# Patient Record
Sex: Male | Born: 1985 | Race: White | Hispanic: No | Marital: Married | Smoking: Never smoker
Health system: Southern US, Community
[De-identification: ages and names within clinical notes are randomized; demographics above are authoritative.]

## PROBLEM LIST (undated history)

## (undated) DIAGNOSIS — G43909 Migraine, unspecified, not intractable, without status migrainosus: Secondary | ICD-10-CM

## (undated) HISTORY — PX: WISDOM TOOTH EXTRACTION: SHX21

---
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
## 2012-08-21 ENCOUNTER — Emergency Department (HOSPITAL_COMMUNITY)
Admission: EM | Admit: 2012-08-21 | Discharge: 2012-08-21 | Disposition: A | Discharge status: Home / Self Care | Payer: BC Managed Care – PPO | Source: Home / Self Care | Attending: Family Medicine | Admitting: Family Medicine

## 2012-08-21 ENCOUNTER — Encounter (HOSPITAL_COMMUNITY): Payer: Self-pay | Admitting: Emergency Medicine

## 2012-08-21 DIAGNOSIS — A0811 Acute gastroenteropathy due to Norwalk agent: Secondary | ICD-10-CM

## 2012-08-21 HISTORY — DX: Migraine, unspecified, not intractable, without status migrainosus: G43.909

## 2012-08-21 MED ORDER — SODIUM CHLORIDE 0.9 % IV SOLN
Freq: Once | INTRAVENOUS | Status: AC
  Administered 2012-08-21: 09:00:00 via INTRAVENOUS

## 2012-08-21 MED ORDER — ONDANSETRON HCL 4 MG/2ML IJ SOLN
4.0000 mg | Freq: Once | INTRAMUSCULAR | Status: AC
  Administered 2012-08-21: 4 mg via INTRAVENOUS

## 2012-08-21 MED ORDER — ONDANSETRON HCL 4 MG PO TABS: 4.0000 mg | ORAL_TABLET | Freq: Four times a day (QID) | ORAL | Status: DC

## 2012-08-21 MED ORDER — ONDANSETRON HCL 4 MG/2ML IJ SOLN
INTRAMUSCULAR | Status: AC
  Filled 2012-08-21: qty 2

## 2012-08-21 NOTE — ED Provider Notes (Signed)
  CSN: [id number]     Arrival date & time 08/21/12  7829 History     First MD Initiated Contact with Patient 08/21/12 [phone]     Chief Complaint  Patient presents with  . Emesis   (Consider location/radiation/quality/duration/timing/severity/associated sxs/prior Treatment) Patient is a 27 y.o. male presenting with vomiting. The history is provided by the patient and the spouse.  Emesis Severity:  Moderate Duration:  1 day Timing:  Intermittent Quality:  Bilious material Progression:  Unchanged Recent urination:  Normal Associated symptoms: chills and diarrhea   Associated symptoms: no fever   Risk factors: no sick contacts, no suspect food intake and no travel to endemic areas     Past Medical History  Diagnosis Date  . Migraine    History reviewed. No pertinent past surgical history. No family history on file. History  Substance Use Topics  . Smoking status: Never Smoker   . Smokeless tobacco: Not on file  . Alcohol Use: Yes    Review of Systems  Constitutional: Positive for chills and appetite change. Negative for fever.  Gastrointestinal: Positive for nausea, vomiting and diarrhea. Negative for blood in stool.  Genitourinary: Negative.     Allergies  Review of patient's allergies indicates no known allergies.  Home Medications   Current Outpatient Rx  Name  Route  Sig  Dispense  Refill  . Bismuth Subsalicylate (PEPTO-BISMOL PO)   Oral   Take by mouth.         . Rizatriptan Benzoate (MAXALT PO)   Oral   Take by mouth.         . ondansetron (ZOFRAN) 4 MG tablet   Oral   Take 1 tablet (4 mg total) by mouth every 6 (six) hours. Prn n/v   8 tablet   0    BP 105/68  Pulse 131  Temp(Src) 99 F (37.2 C) (Oral) Physical Exam  Nursing note and vitals reviewed. Constitutional: He is oriented to person, place, and time. He appears well-developed and well-nourished.  HENT:  Mouth/Throat: Oropharynx is clear and moist.  Neck: Normal range of motion. Neck  supple.  Cardiovascular: Normal rate.   Pulmonary/Chest: Breath sounds normal.  Abdominal: Soft. Bowel sounds are normal. He exhibits no distension and no mass. There is generalized tenderness. There is no rigidity, no rebound, no guarding and no CVA tenderness.  Lymphadenopathy:    He has no cervical adenopathy.  Neurological: He is alert and oriented to person, place, and time.  Skin: Skin is warm and dry.    ED Course   Procedures (including critical care time)  Labs Reviewed - No data to display No results found. 1. Gastroenteritis due to norovirus     MDM  Sx improved after ivf and meds, agrees with d/c.  Linna Hoff, MD 08/21/12 1016

## 2012-08-21 NOTE — ED Notes (Signed)
Reports "upset stomach" since Tuesday, vomiting started last night.  Reports chills, 5-6 episodes of vomiting today and 1 episode of diarrhea this morning.

## 2014-03-23 ENCOUNTER — Ambulatory Visit: Payer: Self-pay | Admitting: Internal Medicine

## 2017-10-01 ENCOUNTER — Ambulatory Visit (HOSPITAL_COMMUNITY)
Admission: EM | Admit: 2017-10-01 | Discharge: 2017-10-01 | Disposition: A | Discharge status: Home / Self Care | Payer: BLUE CROSS/BLUE SHIELD | Attending: Family Medicine | Admitting: Family Medicine

## 2017-10-01 ENCOUNTER — Encounter (HOSPITAL_COMMUNITY): Payer: Self-pay | Admitting: Emergency Medicine

## 2017-10-01 DIAGNOSIS — H6983 Other specified disorders of Eustachian tube, bilateral: Secondary | ICD-10-CM

## 2017-10-01 DIAGNOSIS — H6993 Unspecified Eustachian tube disorder, bilateral: Secondary | ICD-10-CM

## 2017-10-01 DIAGNOSIS — R42 Dizziness and giddiness: Secondary | ICD-10-CM

## 2017-10-01 MED ORDER — CETIRIZINE HCL 5 MG PO TABS: 5.0000 mg | ORAL_TABLET | Freq: Every day | ORAL | 1 refills | Status: DC

## 2017-10-01 MED ORDER — FLUTICASONE PROPIONATE 50 MCG/ACT NA SUSP: 1.0000 | Freq: Every day | NASAL | 2 refills | Status: DC

## 2017-10-01 NOTE — ED Provider Notes (Signed)
[facility]    CSN: [id number] Arrival date & time: 10/01/17  1206     History   Chief Complaint Chief Complaint  Patient presents with  . Dizziness  . Nasal Congestion    HPI Tyler Spencer is a 32 y.o. male.   Patient is a healthy 32 year old male that presents with imbalance and equilibrium, mild headache, ear pressure and popping in ears.  This is been intermittent but remained the same over the last 3 or 4 days.  He denies any fever, runny nose, sore throat, cough.  Reports when he wakes up in the morning he has some mild nasal congestion with mucus but that clears out throughout the day.  He has not been taking anything for his symptoms.  He does not have a headache currently.  He does have a history of migraines that are associated with allergies to certain foods.  He denies any chest pain, shortness of breath, palpitations.     Past Medical History:  Diagnosis Date  . Migraine     There are no active problems to display for this patient.   History reviewed. No pertinent surgical history.     Home Medications    Prior to Admission medications   Medication Sig Start Date End Date Taking? Authorizing Provider  Bismuth Subsalicylate (PEPTO-BISMOL PO) Take by mouth.    [provider]  cetirizine (ZYRTEC) 5 MG tablet Take 1 tablet (5 mg total) by mouth daily. 10/01/17   Kayci Belleville, Gloris Manchesterraci A, NP  fluticasone (FLONASE) 50 MCG/ACT nasal spray Place 1 spray into both nostrils daily. 10/01/17   Travas Schexnayder, Gloris Manchesterraci A, NP  ondansetron (ZOFRAN) 4 MG tablet Take 1 tablet (4 mg total) by mouth every 6 (six) hours. Prn n/v Patient not taking: Reported on 10/01/2017 08/21/12   Linna HoffKindl, James D, MD  Rizatriptan Benzoate (MAXALT PO) Take by mouth.    [provider]    Family History Family History  Family history unknown: Yes    Social History Social History   Tobacco Use  . Smoking status: Never Smoker  Substance Use Topics  . Alcohol use: Yes  . Drug  use: No     Allergies   Patient has no known allergies.   Review of Systems Review of Systems  Constitutional: Positive for activity change. Negative for appetite change, fatigue and fever.  HENT: Positive for congestion, sinus pressure and tinnitus.   Eyes: Negative for pain and redness.  Respiratory: Negative for cough.   Cardiovascular: Negative for chest pain, palpitations and leg swelling.  Skin: Negative for color change, pallor, rash and wound.  Neurological: Positive for dizziness and headaches. Negative for tremors, seizures, syncope, facial asymmetry, speech difficulty, weakness, light-headedness and numbness.  All other systems reviewed and are negative.    Physical Exam Triage Vital Signs ED Triage Vitals  Enc Vitals Group     BP 10/01/17 1227 134/85     Pulse Rate 10/01/17 1227 100     Resp 10/01/17 1227 16     Temp 10/01/17 1227 97.9 F (36.6 C)     Temp src --      SpO2 10/01/17 1227 100 %     Weight --      Height --      Head Circumference --      Peak Flow --      Pain Score 10/01/17 1228 0     Pain Loc --      Pain Edu? --  Excl. in GC? --    No data found.  Updated Vital Signs BP 134/85   Pulse 100   Temp 97.9 F (36.6 C)   Resp 16   SpO2 100%   Visual Acuity Right Eye Distance:   Left Eye Distance:   Bilateral Distance:    Right Eye Near:   Left Eye Near:    Bilateral Near:     Physical Exam  Constitutional: He is oriented to person, place, and time. He appears well-developed and well-nourished.  HENT:  Head: Normocephalic and atraumatic.  Eyes: Pupils are equal, round, and reactive to light. Conjunctivae and EOM are normal.  No nystagmus  Neck: Normal range of motion.  Cardiovascular: Normal rate and regular rhythm.  Pulmonary/Chest: Effort normal and breath sounds normal.  Lungs clear in all fields. No dyspnea or distress. No retractions or nasal flaring.     Neurological: He is alert and oriented to person, place, and  time.  No focal neuro deficits.  Strength 5 out of 5 in all extremities.  Cranial nerves intact.   Skin: Skin is warm.  Psychiatric: He has a normal mood and affect.  Nursing note and vitals reviewed.    UC Treatments / Results  Labs (all labs ordered are listed, but only abnormal results are displayed) Labs Reviewed - No data to display  EKG None  Radiology No results found.  Procedures Procedures (including critical care time)  Medications Ordered in UC Medications - No data to display  Initial Impression / Assessment and Plan / UC Course  I have reviewed the triage vital signs and the nursing notes.  Pertinent labs & imaging results that were available during my care of the patient were reviewed by me and considered in my medical decision making (see chart for details).     Eustachian tube dysfunction-we will try Flonase and Zyrtec for symptoms. Follow-up as needed for worsening or continued symptoms Final Clinical Impressions(s) / UC Diagnoses   Final diagnoses:  Eustachian tube dysfunction, bilateral  Dizziness     Discharge Instructions     It was nice meeting you!!  I believe that you have eustachian tube dysfunction. We will try some Flonase, Zyrtec for your symptoms. Let us know if you are not getting better in the next couple of days    ED Prescriptions    Medication Sig Dispense Auth. Provider   fluticasone (FLONASE) 50 MCG/ACT nasal spray Place 1 spray into both nostrils daily. 16 g Karima Carrell A, NP   cetirizine (ZYRTEC) 5 MG tablet Take 1 tablet (5 mg total) by mouth daily. 30 tablet Dahlia Byes A, NP     Controlled Substance Prescriptions [place] Controlled Substance Registry consulted? Not Applicable   Janace Aris, NP 10/02/17 [phone]

## 2017-10-01 NOTE — Discharge Instructions (Signed)
It was nice meeting you!!  I believe that you have eustachian tube dysfunction. We will try some Flonase, Zyrtec for your symptoms. Let us know if you are not getting better in the next couple of days

## 2017-10-01 NOTE — ED Triage Notes (Signed)
Pt states off and on since Sunday, c/o dizziness, lightheadedness, fatigue, loss of appetite. Pt ambulatory with steady gait. Pt states his hands and feet have off and on tingling sensation, cold and hot feeling. Pt states when he wakes up in the morning hes had a lot of mucous build up.

## 2017-12-28 DIAGNOSIS — Z131 Encounter for screening for diabetes mellitus: Secondary | ICD-10-CM | POA: Insufficient documentation

## 2017-12-28 DIAGNOSIS — Z13220 Encounter for screening for lipoid disorders: Secondary | ICD-10-CM | POA: Insufficient documentation

## 2017-12-28 DIAGNOSIS — G43909 Migraine, unspecified, not intractable, without status migrainosus: Secondary | ICD-10-CM | POA: Insufficient documentation

## 2017-12-28 NOTE — Progress Notes (Signed)
Tyler Spencer DOB: August 13, 1985 Encounter date: 12/29/2017  This is a 32 y.o. male who presents to establish care. Chief Complaint  Patient presents with  . New Patient (Initial Visit)   Has not had a regular primary in years.   History of present illness: Migraine: maxalt in the past. Started getting headaches around 2007 (senior year of high school) - Starts with aura and then "goes blind". Initially thought food related and cut out pork, meats, processed meats but put those back in diet a couple of years ago and didn't have increase in migraines. Thinks now they are more stress induced. Has had 2 so far this year. Usually 1-2/year. Used to have a neurologist in [place] and was prescribed the maxalt but it made him feel really weird for a week after. Typically takes excedrin migraine which does the trick. Duration varies. Last one lasted 3 hours. Sometimes pain first, sometimes blinding first. Not always together. Usually left eye involved. Feels that they affect memory as well. Loses total vision left eye - like a very cloudy glass - sees shapes but that's about it. Works 2 minutes from home so has been able to get home before sx continued. Does get a regular headache with storm front, weather changes. These are more sharp pains around eyes/sinuses. Takes tylenol/ibuprofen for these which does help. Gets these every week to every other week. Sometimes notes with caffeine withdrawal.   Has been at new job for a month. Not very busy at work and during winter tends to feel more fatigued. Stress of holidays wears on them.   Past Medical History:  Diagnosis Date  . Migraine    Past Surgical History:  Procedure Laterality Date  . WISDOM TOOTH EXTRACTION     No Known Allergies No outpatient medications have been marked as taking for the 12/29/17 encounter (Office Visit) with Wynn BankerKoberlein, Junell C, MD.   Social History   Tobacco Use  . Smoking status: Never Smoker  . Smokeless tobacco:  Never Used  Substance Use Topics  . Alcohol use: Yes    Alcohol/week: 7.0 standard drinks    Types: 7 Cans of beer per week   Family History  Problem Relation Age of Onset  . Heart disease Mother        RMSF followed by a coma x 3 months; affected heart  . Migraines Mother   . High blood pressure Father   . High Cholesterol Father   . Cancer Maternal Grandmother   . AAA (abdominal aortic aneurysm) Maternal Grandfather   . Other Paternal Grandmother        died with pneumonia     Review of Systems  Constitutional: Negative for activity change, appetite change, chills, fatigue, fever and unexpected weight change.  HENT: Negative for congestion, ear pain, hearing loss, sinus pressure, sinus pain, sore throat and trouble swallowing.   Eyes: Negative for pain and visual disturbance.  Respiratory: Negative for cough, chest tightness, shortness of breath and wheezing.   Cardiovascular: Negative for chest pain, palpitations and leg swelling.  Gastrointestinal: Negative for abdominal distention, abdominal pain, blood in stool, constipation, diarrhea, nausea and vomiting.  Genitourinary: Negative for decreased urine volume, difficulty urinating, dysuria, penile pain and testicular pain.  Musculoskeletal: Negative for arthralgias, back pain and joint swelling.  Skin: Negative for rash.  Neurological: Positive for headaches (see hpi). Negative for dizziness, weakness and numbness (some facial with migraines. has had imaging and neuro evaluation in past).  Hematological: Negative for adenopathy. Does not  bruise/bleed easily.  Psychiatric/Behavioral: Negative for agitation, sleep disturbance and suicidal ideas. The patient is not nervous/anxious.     Objective:  BP 130/70 (BP Location: Left Arm, Patient Position: Sitting, Cuff Size: Normal)   Pulse (!) 101   Temp 98.1 F (36.7 C) (Oral)   Ht 6' 0.5" (1.842 m)   Wt 166 lb 9.6 oz (75.6 kg)   SpO2 99%   BMI 22.28 kg/m   Weight: 166 lb 9.6  oz (75.6 kg)   BP Readings from Last 3 Encounters:  12/29/17 130/70  10/01/17 134/85  08/21/12 105/68   Wt Readings from Last 3 Encounters:  12/29/17 166 lb 9.6 oz (75.6 kg)    Physical Exam Constitutional:      General: He is not in acute distress.    Appearance: He is well-developed.  HENT:     Head: Normocephalic and atraumatic.     Right Ear: External ear normal.     Left Ear: External ear normal.     Nose: Nose normal.     Mouth/Throat:     Pharynx: No oropharyngeal exudate.  Eyes:     Conjunctiva/sclera: Conjunctivae normal.     Pupils: Pupils are equal, round, and reactive to light.  Neck:     Musculoskeletal: Neck supple.     Thyroid: No thyromegaly.  Cardiovascular:     Rate and Rhythm: Normal rate and regular rhythm.     Heart sounds: Normal heart sounds. No murmur. No friction rub. No gallop.   Pulmonary:     Effort: Pulmonary effort is normal. No respiratory distress.     Breath sounds: Normal breath sounds. No stridor. No wheezing or rales.  Abdominal:     General: Bowel sounds are normal.     Palpations: Abdomen is soft.  Genitourinary:    Penis: Normal and circumcised.      Scrotum/Testes: Normal.        Right: Mass or tenderness not present. Right testis is descended.        Left: Mass or tenderness not present. Left testis is descended.  Musculoskeletal: Normal range of motion.  Skin:    General: Skin is warm and dry.  Neurological:     Mental Status: He is alert and oriented to person, place, and time.  Psychiatric:        Behavior: Behavior normal.        Thought Content: Thought content normal.        Judgment: Judgment normal.     Assessment/Plan: 1. Preventative health care Return to regular exercise program.   2. Lipid screening  - Lipid panel; Future  3. Screening for diabetes mellitus  - Comprehensive metabolic panel; Future  4. Migraine without status migrainosus, not intractable, unspecified migraine type Stable. Able to  control sx with OTC medications. Let us know if any worsening.  - CBC with Differential/Platelet; Future  5. Other fatigue  - TSH; Future  Return in about 1 year (around 12/30/2018) for physical exam.  Theodis Shove, MD

## 2017-12-29 ENCOUNTER — Ambulatory Visit (INDEPENDENT_AMBULATORY_CARE_PROVIDER_SITE_OTHER): Payer: BLUE CROSS/BLUE SHIELD | Admitting: Family Medicine

## 2017-12-29 ENCOUNTER — Encounter: Payer: Self-pay | Admitting: Family Medicine

## 2017-12-29 VITALS — BP 130/70 | HR 101 | Temp 98.1°F | Ht 72.5 in | Wt 166.6 lb

## 2017-12-29 DIAGNOSIS — R5383 Other fatigue: Secondary | ICD-10-CM

## 2017-12-29 DIAGNOSIS — Z131 Encounter for screening for diabetes mellitus: Secondary | ICD-10-CM | POA: Diagnosis not present

## 2017-12-29 DIAGNOSIS — G43909 Migraine, unspecified, not intractable, without status migrainosus: Secondary | ICD-10-CM | POA: Diagnosis not present

## 2017-12-29 DIAGNOSIS — Z0000 Encounter for general adult medical examination without abnormal findings: Secondary | ICD-10-CM | POA: Diagnosis not present

## 2017-12-29 DIAGNOSIS — Z13220 Encounter for screening for lipoid disorders: Secondary | ICD-10-CM | POA: Diagnosis not present

## 2017-12-29 NOTE — Patient Instructions (Signed)
It was nice meeting you today! I will call you with your bloodwork results when I get them. If you haven't heard from me in 2 weeks then contact us to ask about results.    Why is Exercise Important? If I told you I had a single pill that would help you decrease stress by improving anxiety, decreasing depression, help you achieve a healthy weight, give you more energy, make you more productive, help you focus, decrease your risk of dementia/heart attack/stroke/falls, improve your bone health, and more would you be interested? These are just some of the benefits that exercise brings to you. IT IS WORTH carving out some time every day to fit in exercise. It will help in every aspect of your health. Even if you have injuries that prevent you from participating in a type of exercise you used to do; there is always something that you can do to keep exercise a part of your life. If improving your health is important, make exercise your priority. It is worth the time! If you have questions about the type of exercise that is right for you, please talk with me about this!     Exercising to Stay Healthy  Exercising regularly is important. It has many health benefits, such as:  Improving your overall fitness, flexibility, and endurance.  Increasing your bone density.  Helping with weight control.  Decreasing your body fat.  Increasing your muscle strength.  Reducing stress and tension.  Improving your overall health.   In order to become healthy and stay healthy, it is recommended that you do moderate-intensity and vigorous-intensity exercise. You can tell that you are exercising at a moderate intensity if you have a higher heart rate and faster breathing, but you are still able to hold a conversation. You can tell that you are exercising at a vigorous intensity if you are breathing much harder and faster and cannot hold a conversation while exercising. How often should I exercise? Choose an  activity that you enjoy and set realistic goals. Your health care provider can help you to make an activity plan that works for you. Exercise regularly as directed by your health care provider. This may include:  Doing resistance training twice each week, such as: ? Push-ups. ? Sit-ups. ? Lifting weights. ? Using resistance bands.  Doing a given intensity of exercise for a given amount of time. Choose from these options: ? 150 minutes of moderate-intensity exercise every week. ? 75 minutes of vigorous-intensity exercise every week. ? A mix of moderate-intensity and vigorous-intensity exercise every week.   Children, pregnant women, people who are out of shape, people who are overweight, and older adults may need to consult a health care provider for individual recommendations. If you have any sort of medical condition, be sure to consult your health care provider before starting a new exercise program. What are some exercise ideas? Some moderate-intensity exercise ideas include:  Walking at a rate of 1 mile in 15 minutes.  Biking.  Hiking.  Golfing.  Dancing.   Some vigorous-intensity exercise ideas include:  Walking at a rate of at least 4.5 miles per hour.  Jogging or running at a rate of 5 miles per hour.  Biking at a rate of at least 10 miles per hour.  Lap swimming.  Roller-skating or in-line skating.  Cross-country skiing.  Vigorous competitive sports, such as football, basketball, and soccer.  Jumping rope.  Aerobic dancing.   What are some everyday activities that can help me  to get exercise?  Yard work, such as: ? Pushing a Surveyor, mining. ? Raking and bagging leaves.  Washing and waxing your car.  Pushing a stroller.  Shoveling snow.  Gardening.  Washing windows or floors. How can I be more active in my day-to-day activities?  Use the stairs instead of the elevator.  Take a walk during your lunch break.  If you drive, park your car farther  away from work or school.  If you take public transportation, get off one stop early and walk the rest of the way.  Make all of your phone calls while standing up and walking around.  Get up, stretch, and walk around every 30 minutes throughout the day. What guidelines should I follow while exercising?  Do not exercise so much that you hurt yourself, feel dizzy, or get very short of breath.  Consult your health care provider before starting a new exercise program.  Wear comfortable clothes and shoes with good support.  Drink plenty of water while you exercise to prevent dehydration or heat stroke. Body water is lost during exercise and must be replaced.  Work out until you breathe faster and your heart beats faster. This information is not intended to replace advice given to you by your health care provider. Make sure you discuss any questions you have with your health care provider.

## 2018-01-27 ENCOUNTER — Ambulatory Visit: Payer: BLUE CROSS/BLUE SHIELD | Admitting: Internal Medicine

## 2018-01-27 ENCOUNTER — Encounter: Payer: Self-pay | Admitting: Internal Medicine

## 2018-01-27 ENCOUNTER — Ambulatory Visit: Payer: Self-pay

## 2018-01-27 ENCOUNTER — Ambulatory Visit (INDEPENDENT_AMBULATORY_CARE_PROVIDER_SITE_OTHER): Payer: BLUE CROSS/BLUE SHIELD

## 2018-01-27 VITALS — BP 124/76 | HR 110 | Temp 99.0°F | Ht 73.0 in | Wt 165.2 lb

## 2018-01-27 DIAGNOSIS — R079 Chest pain, unspecified: Secondary | ICD-10-CM

## 2018-01-27 NOTE — Telephone Encounter (Signed)
Pt c/o 2 day h/o chest pain. Pt stated that the pain is mild to moderate and is located to the middle of the chest and goes from left to right and pt stated the location varies. Pt stated that the pain radiates to the back. Pt stated the chest pain comes and goes . Pt has no cardiac risk factors. Pt thinks it could be a pulled muscle because it hurts to take a deep breath in. Pt stated he had a "violent" case of the hiccups last week. Pt is having lightheadedness. Pt stated he has chronic lightheadedness when :the barometric pressure is low." Pt stated that his lightheadedness is not worse than usual. Pt c/o feeling heartburn, pt described it feels like "lava." Called PCP office and spoke with [facility] and informed her of triage details. Per Zenon Mayo, pt can come in at 3:45 to see Dr Fabian Sharp.   Reason for Disposition . Chest pain lasts > 5 minutes (Exceptions: chest pain occurring > 3 days ago and now asymptomatic; same as previously diagnosed heartburn and has accompanying sour taste in mouth)  Answer Assessment - Initial Assessment Questions 1. LOCATION: "Where does it hurt?"       Middle of chest and then goes from left to right of chest - varies with different parts of chest 2. RADIATION: "Does the pain go anywhere else?" (e.g., into neck, jaw, arms, back)     back 3. ONSET: "When did the chest pain begin?" (Minutes, hours or days)      Last night  4. PATTERN "Does the pain come and go, or has it been constant since it started?"  "Does it get worse with exertion?"      Comes and goes goes an hour without it hurting then will hurt again for another two hours - hurts when takes a deep breath throbbing dull pian to sharp 5. DURATION: "How long does it last" (e.g., seconds, minutes, hours)     Varies the amount of time that the chest pain is present 6. SEVERITY: "How bad is the pain?"  (e.g., Scale 1-10; mild, moderate, or severe)    - MILD (1-3): doesn't interfere with normal activities     - MODERATE  (4-7): interferes with normal activities or awakens from sleep    - SEVERE (8-10): excruciating pain, unable to do any normal activities       Mild to moderate 7. CARDIAC RISK FACTORS: "Do you have any history of heart problems or risk factors for heart disease?" (e.g., prior heart attack, angina; high blood pressure, diabetes, being overweight, high cholesterol, smoking, or strong family history of heart disease)     no 8. PULMONARY RISK FACTORS: "Do you have any history of lung disease?"  (e.g., blood clots in lung, asthma, emphysema, birth control pills)     no 9. CAUSE: "What do you think is causing the chest pain?"     Pulled muscle or "violent hiccups" last week 10. OTHER SYMPTOMS: "Do you have any other symptoms?" (e.g., dizziness, nausea, vomiting, sweating, fever, difficulty breathing, cough)       H/o lightheadedness when the barometric is low, pt states that lightheadedness (pt has a h/o lightheadedness), heartburn off and on "feels like lava"- sweating along with chest pain 11. PREGNANCY: "Is there any chance you are pregnant?" "When was your last menstrual period?"       n/a  Protocols used: CHEST PAIN-A-AH

## 2018-01-27 NOTE — Patient Instructions (Signed)
Your exam and ekg and chest x ray are normal which is reassuring  At t his time would like to  Add an acid blocker  For poss acid related symptoms    Take  prilosec every day and make fu appt with dr Fuller [place]   In 10 - 14 days   Or earlier if  Getting worse .     Nonspecific Chest Pain, Adult Chest pain can be caused by many different conditions. It can be caused by a condition that is life-threatening and requires treatment right away. It can also be caused by something that is not life-threatening. If you have chest pain, it can be hard to know the difference, so it is important to get help right away to make sure that you do not have a serious condition. Some life-threatening causes of chest pain include:  Heart attack.  A tear in the body's main blood vessel (aortic dissection).  Inflammation around your heart (pericarditis).  A problem in the lungs, such as a blood clot (pulmonary embolism) or a collapsed lung (pneumothorax). Some non life-threatening causes of chest pain include:  Heartburn.  Anxiety or stress.  Damage to the bones, muscles, and cartilage that make up your chest wall.  Pneumonia or bronchitis.  Shingles infection (varicella-zoster virus). Chest pain can feel like:  Pain or discomfort on the surface of your chest or deep in your chest.  Crushing, pressure, aching, or squeezing pain.  Burning or tingling.  Dull or sharp pain that is worse when you move, cough, or take a deep breath.  Pain or discomfort that is also felt in your back, neck, jaw, shoulder, or arm, or pain that spreads to any of these areas. Your chest pain may come and go. It may also be constant. Your health care provider will do lab tests and other studies to find the cause of your pain. Treatment will depend on the cause of your chest pain. Follow these instructions at home: Medicines  Take over-the-counter and prescription medicines only as told by your health care provider.  If  you were prescribed an antibiotic, take it as told by your health care provider. Do not stop taking the antibiotic even if you start to feel better. Lifestyle   Rest as directed by your health care provider.  Do not use any products that contain nicotine or tobacco, such as cigarettes and e-cigarettes. If you need help quitting, ask your health care provider.  Do not drink alcohol.  Make healthy lifestyle choices as recommended. These may include: ? Getting regular exercise. Ask your health care provider to suggest some activities that are safe for you. ? Eating a heart-healthy diet. This includes plenty of fresh fruits and vegetables, whole grains, low-fat (lean) protein, and low-fat dairy products. A dietitian can help you find healthy eating options. ? Maintaining a healthy weight. ? Managing any other health conditions you have, such as high blood pressure (hypertension) or diabetes. ? Reducing stress, such as with yoga or relaxation techniques. General instructions  Pay attention to any changes in your symptoms. Tell your health care provider about them or any new symptoms.  Avoid any activities that cause chest pain.  Keep all follow-up visits as told by your health care provider. This is important. This includes visits for any further testing if your chest pain does not go away. Contact a health care provider if:  Your chest pain does not go away.  You feel depressed.  You have a fever. Get  help right away if:  Your chest pain gets worse.  You have a cough that gets worse, or you cough up blood.  You have severe pain in your abdomen.  You faint.  You have sudden, unexplained chest discomfort.  You have sudden, unexplained discomfort in your arms, back, neck, or jaw.  You have shortness of breath at any time.  You suddenly start to sweat, or your skin gets clammy.  You feel nausea or you vomit.  You suddenly feel lightheaded or dizzy.  You have severe  weakness, or unexplained weakness or fatigue.  Your heart begins to beat quickly, or it feels like it is skipping beats. These symptoms may represent a serious problem that is an emergency. Do not wait to see if the symptoms will go away. Get medical help right away. Call your local emergency services (911 in the [place] Do not drive yourself to the hospital. Summary  Chest pain can be caused by a condition that is serious and requires urgent treatment. It may also be caused by something that is not life-threatening.  If you have chest pain, it is very important to see your health care provider. Your health care provider may do lab tests and other studies to find the cause of your pain.  Follow your health care provider's instructions on taking medicines, making lifestyle changes, and getting emergency treatment if symptoms become worse.  Keep all follow-up visits as told by your health care provider. This includes visits for any further testing if your chest pain does not go away. This information is not intended to replace advice given to you by your health care provider. Make sure you discuss any questions you have with your health care provider. Document Released: 10/10/2004 Document Revised: 07/03/2017 Document Reviewed: 07/03/2017 Elsevier Interactive Patient Education  2019 [employer].

## 2018-01-27 NOTE — Progress Notes (Signed)
Chief Complaint  Patient presents with  . Heartburn    started saturday with acid reflux and chest pain stated that after burping it would go away and started feeling it in back. pt states that this morning it started hurting when breathing a dull pain     HPI: Tyler Spencer 33 y.o. come in for  Acute sda  Sent in by nurse triage  initially advised be seen in ED   PCP NA today   Onset jan11 evening  sat  Had left parasternal pain and took  tums  And then last night  Noted still  discomfort and then right side and then ocass back .  Current 3/10 was 6/10 but no assoc sx such as  Sweating sob  No hx of same x When pulled chest muscle.  In chest   Waxes and wanes and not more of discomfort  And sitting is  lower  High epigastric.   Area .  Was able to walk a lot   2 days ago without cp and no change  In activity tolerance  Stress also  New job. Banking   No nocturnal  Sx  Eating sometimes makes feel better but  But  Pizza  . Spicy  Acid   May make worse .  No vomiting no fever.   Has a nervous stomach feel.  ROS: See pertinent positives and negatives per HPI. No fever cough    Past Medical History:  Diagnosis Date  . Migraine     Family History  Problem Relation Age of Onset  . Heart disease Mother        RMSF followed by a coma x 3 months; affected heart  . Migraines Mother   . High blood pressure Father   . High Cholesterol Father   . Cancer Maternal Grandmother   . AAA (abdominal aortic aneurysm) Maternal Grandfather   . Other Paternal Grandmother        died with pneumonia    Social History   Socioeconomic History  . Marital status: Single    Spouse name: Not on file  . Number of children: Not on file  . Years of education: Not on file  . Highest education level: Not on file  Occupational History  . Not on file  Social Needs  . Financial resource strain: Not on file  . Food insecurity:    Worry: Not on file    Inability: Not on file  . Transportation  needs:    Medical: Not on file    Non-medical: Not on file  Tobacco Use  . Smoking status: Never Smoker  . Smokeless tobacco: Never Used  Substance and Sexual Activity  . Alcohol use: Yes    Alcohol/week: 7.0 standard drinks    Types: 7 Cans of beer per week  . Drug use: No  . Sexual activity: Yes    Partners: Female  Lifestyle  . Physical activity:    Days per week: Not on file    Minutes per session: Not on file  . Stress: Not on file  Relationships  . Social connections:    Talks on phone: Not on file    Gets together: Not on file    Attends religious service: Not on file    Active member of club or organization: Not on file    Attends meetings of clubs or organizations: Not on file    Relationship status: Not on file  Other Topics Concern  . Not  on file  Social History Narrative  . Not on file    No outpatient medications prior to visit.   No facility-administered medications prior to visit.      EXAM:  BP 124/76 (BP Location: Right Arm, Patient Position: Sitting, Cuff Size: Normal)   Pulse (!) 110   Temp 99 F (37.2 C) (Oral)   Ht 6\' 1"  (1.854 m)   Wt 165 lb 3.2 oz (74.9 kg)   SpO2 99%   BMI 21.80 kg/m   Body mass index is 21.8 kg/m.  GENERAL: vitals reviewed and listed above, alert, oriented, appears well hydrated and in no acute distress HEENT: atraumatic, conjunctiva  clear, no obvious abnormalities on inspection of external nose and ears OP : no lesion edema or exudate  NECK: no obvious masses on inspection palpation  LUNGS: clear to auscultation bilaterally, no wheezes, rales or rhonchi, good air movement CV: HRRR, no clubbing cyanosis or  peripheral edema nl cap refill  Abdomen:  Sof,t normal bowel sounds without hepatosplenomegaly, no guarding rebound or masses no CVA tenderness  MS: moves all extremities without noticeable focal  abnormality PSYCH: pleasant and cooperative,  No results found for: WBC, HGB, HCT, PLT, GLUCOSE, CHOL, TRIG, HDL,  LDLDIRECT, LDLCALC, ALT, AST, NA, K, CL, CREATININE, BUN, CO2, TSH, PSA, INR, GLUF, HGBA1C, MICROALBUR BP Readings from Last 3 Encounters:  01/27/18 124/76  12/29/17 130/70  10/01/17 134/85   EKG NSR no acute findings  ASSESSMENT AND PLAN:  Discussed the following assessment and plan:  Chest pain in adult - Plan: EKG 12-Lead, DG Chest 2 View  -Patient advised to return or notify health care team  if  new concerns arise.  Patient Instructions  Your exam and ekg and chest x ray are normal which is reassuring  At t his time would like to  Add an acid blocker  For poss acid related symptoms    Take  prilosec every day and make fu appt with dr Fuller [place]   In 10 - 14 days   Or earlier if  Getting worse .     Nonspecific Chest Pain, Adult Chest pain can be caused by many different conditions. It can be caused by a condition that is life-threatening and requires treatment right away. It can also be caused by something that is not life-threatening. If you have chest pain, it can be hard to know the difference, so it is important to get help right away to make sure that you do not have a serious condition. Some life-threatening causes of chest pain include:  Heart attack.  A tear in the body's main blood vessel (aortic dissection).  Inflammation around your heart (pericarditis).  A problem in the lungs, such as a blood clot (pulmonary embolism) or a collapsed lung (pneumothorax). Some non life-threatening causes of chest pain include:  Heartburn.  Anxiety or stress.  Damage to the bones, muscles, and cartilage that make up your chest wall.  Pneumonia or bronchitis.  Shingles infection (varicella-zoster virus). Chest pain can feel like:  Pain or discomfort on the surface of your chest or deep in your chest.  Crushing, pressure, aching, or squeezing pain.  Burning or tingling.  Dull or sharp pain that is worse when you move, cough, or take a deep breath.  Pain or discomfort  that is also felt in your back, neck, jaw, shoulder, or arm, or pain that spreads to any of these areas. Your chest pain may come and go. It may also be constant.  Your health care provider will do lab tests and other studies to find the cause of your pain. Treatment will depend on the cause of your chest pain. Follow these instructions at home: Medicines  Take over-the-counter and prescription medicines only as told by your health care provider.  If you were prescribed an antibiotic, take it as told by your health care provider. Do not stop taking the antibiotic even if you start to feel better. Lifestyle   Rest as directed by your health care provider.  Do not use any products that contain nicotine or tobacco, such as cigarettes and e-cigarettes. If you need help quitting, ask your health care provider.  Do not drink alcohol.  Make healthy lifestyle choices as recommended. These may include: ? Getting regular exercise. Ask your health care provider to suggest some activities that are safe for you. ? Eating a heart-healthy diet. This includes plenty of fresh fruits and vegetables, whole grains, low-fat (lean) protein, and low-fat dairy products. A dietitian can help you find healthy eating options. ? Maintaining a healthy weight. ? Managing any other health conditions you have, such as high blood pressure (hypertension) or diabetes. ? Reducing stress, such as with yoga or relaxation techniques. General instructions  Pay attention to any changes in your symptoms. Tell your health care provider about them or any new symptoms.  Avoid any activities that cause chest pain.  Keep all follow-up visits as told by your health care provider. This is important. This includes visits for any further testing if your chest pain does not go away. Contact a health care provider if:  Your chest pain does not go away.  You feel depressed.  You have a fever. Get help right away if:  Your chest  pain gets worse.  You have a cough that gets worse, or you cough up blood.  You have severe pain in your abdomen.  You faint.  You have sudden, unexplained chest discomfort.  You have sudden, unexplained discomfort in your arms, back, neck, or jaw.  You have shortness of breath at any time.  You suddenly start to sweat, or your skin gets clammy.  You feel nausea or you vomit.  You suddenly feel lightheaded or dizzy.  You have severe weakness, or unexplained weakness or fatigue.  Your heart begins to beat quickly, or it feels like it is skipping beats. These symptoms may represent a serious problem that is an emergency. Do not wait to see if the symptoms will go away. Get medical help right away. Call your local emergency services (911 in the [place] Do not drive yourself to the hospital. Summary  Chest pain can be caused by a condition that is serious and requires urgent treatment. It may also be caused by something that is not life-threatening.  If you have chest pain, it is very important to see your health care provider. Your health care provider may do lab tests and other studies to find the cause of your pain.  Follow your health care provider's instructions on taking medicines, making lifestyle changes, and getting emergency treatment if symptoms become worse.  Keep all follow-up visits as told by your health care provider. This includes visits for any further testing if your chest pain does not go away. This information is not intended to replace advice given to you by your health care provider. Make sure you discuss any questions you have with your health care provider. Document Released: 10/10/2004 Document Revised: 07/03/2017 Document Reviewed: 07/03/2017 Elsevier Interactive  Patient Education  [address]2019 [employer]      [place]anda K. Crayton Savarese M.D.

## 2018-10-07 ENCOUNTER — Other Ambulatory Visit: Payer: Self-pay

## 2018-10-07 DIAGNOSIS — Z20822 Contact with and (suspected) exposure to covid-19: Secondary | ICD-10-CM

## 2018-10-09 LAB — NOVEL CORONAVIRUS, NAA: SARS-CoV-2, NAA: NOT DETECTED

## 2019-01-22 ENCOUNTER — Encounter: Payer: Self-pay | Admitting: Family Medicine

## 2019-01-26 ENCOUNTER — Ambulatory Visit: Payer: BC Managed Care – PPO | Admitting: Family Medicine

## 2019-01-26 ENCOUNTER — Telehealth: Payer: Self-pay | Admitting: *Deleted

## 2019-01-26 ENCOUNTER — Encounter: Payer: Self-pay | Admitting: Family Medicine

## 2019-01-26 ENCOUNTER — Other Ambulatory Visit: Payer: Self-pay

## 2019-01-26 VITALS — BP 116/80 | HR 104 | Temp 98.2°F | Ht 73.0 in | Wt 168.4 lb

## 2019-01-26 DIAGNOSIS — R300 Dysuria: Secondary | ICD-10-CM

## 2019-01-26 LAB — POCT URINALYSIS DIPSTICK
Bilirubin, UA: NEGATIVE
Blood, UA: NEGATIVE
Glucose, UA: NEGATIVE
Ketones, UA: NEGATIVE
Leukocytes, UA: NEGATIVE
Nitrite, UA: NEGATIVE
Protein, UA: NEGATIVE
Spec Grav, UA: 1.015 (ref 1.010–1.025)
Urobilinogen, UA: 0.2 E.U./dL
pH, UA: 6 (ref 5.0–8.0)

## 2019-01-26 NOTE — Telephone Encounter (Signed)
-----   Message from Wynn Banker, MD sent at 01/25/2019  1:44 PM EST ----- Sorry I am sending this in the wrong place in the chart.   Can you please schedule him for a physical? OK to use virtual slot; please give 30 min. He mentioned in patient message some testicular discomfort so if appointment is a bit out, I am ok with ordering testicular US to make sure nothing contributing to discomfort.

## 2019-01-26 NOTE — Telephone Encounter (Signed)
Spoke with the pt and informed him of the message below.  Patient stated he has had some back pain, dysuria and a rash around the genital area.  I advised the pt he should be seen for this problem prior to scheduling a physical and an appt was scheduled with Dr Caryl Never as Dr Hassan Rowan does not have any openings today or tomorrow.  CPE scheduled for 2/5 and message sent to PCP as FYI.

## 2019-01-26 NOTE — Progress Notes (Signed)
  Subjective:     Patient ID: Tyler Spencer, male   DOB: 01-30-1985, 34 y.o.   MRN: [id number]  HPI   [name] is seen with onset Saturday few days ago some urine urgency and slight burning with urination.  No penile discharge.  He denied any fevers or chills.  He had some vague bilateral low back pain which is also somewhat improved.  No history of STD.  He is monogamous. In terms of the urgency he has not had any change in caffeine use.  Also had some recent mild testicular discomfort which is better at this time.  He had noticed over the weekend his scrotum looked redder than usual and he had some itching.  Those symptoms have also resolved.  He has no rash whatsoever at this time.  Past Medical History:  Diagnosis Date  . Migraine    Past Surgical History:  Procedure Laterality Date  . WISDOM TOOTH EXTRACTION      reports that he has never smoked. He has never used smokeless tobacco. He reports current alcohol use of about 7.0 standard drinks of alcohol per week. He reports that he does not use drugs. family history includes AAA (abdominal aortic aneurysm) in his maternal grandfather; Cancer in his maternal grandmother; Heart disease in his mother; High Cholesterol in his father; High blood pressure in his father; Migraines in his mother; Other in his paternal grandmother. Allergies  Allergen Reactions  . Sulfa Antibiotics Rash     Review of Systems  Constitutional: Negative for chills and fever.  Respiratory: Negative for cough and shortness of breath.   Genitourinary: Positive for frequency and urgency. Negative for difficulty urinating, discharge and hematuria.       Objective:   Physical Exam Vitals reviewed.  Constitutional:      Appearance: Normal appearance.  Cardiovascular:     Rate and Rhythm: Normal rate and regular rhythm.  Genitourinary:    Comments: Normal circumcised male.  No penile rash noted.  No scrotal rash noted. Neurological:     Mental Status: He is  alert.        Assessment:     Recent dysuria and urgency.  Urine dipstick is completely normal with no blood, nitrites, or leukocytes.  His symptoms are improving.    Plan:     -We discussed other potential testing such as urine cytologies for things like chlamydia but he has been monogamous and one partner for many years and he declines at this time.  It sounds like he is very low risk for STD. -Since symptoms are improving we recommend observation at this time.  Continue to minimize caffeine intake.  Follow-up for any recurrent symptoms or new symptoms.  He has upcoming physical soon with his primary  Kristian Covey MD [facility] at [facility]

## 2019-01-26 NOTE — Patient Instructions (Signed)
Your urine dipstick was normal- no evidence for likely UTI  Follow up with Dr Hassan Rowan for any persistent symptoms.

## 2019-02-19 ENCOUNTER — Encounter: Payer: Self-pay | Admitting: Family Medicine

## 2019-02-19 ENCOUNTER — Telehealth (INDEPENDENT_AMBULATORY_CARE_PROVIDER_SITE_OTHER): Payer: BC Managed Care – PPO | Admitting: Family Medicine

## 2019-02-19 ENCOUNTER — Other Ambulatory Visit: Payer: Self-pay

## 2019-02-19 ENCOUNTER — Other Ambulatory Visit (HOSPITAL_COMMUNITY)
Admission: RE | Admit: 2019-02-19 | Discharge: 2019-02-19 | Disposition: A | Discharge status: Home / Self Care | Payer: BC Managed Care – PPO | Source: Ambulatory Visit | Attending: Family Medicine | Admitting: Family Medicine

## 2019-02-19 VITALS — BP 110/78 | HR 109 | Temp 98.0°F | Ht 72.75 in | Wt 166.3 lb

## 2019-02-19 DIAGNOSIS — R300 Dysuria: Secondary | ICD-10-CM | POA: Insufficient documentation

## 2019-02-19 DIAGNOSIS — R350 Frequency of micturition: Secondary | ICD-10-CM | POA: Diagnosis not present

## 2019-02-19 DIAGNOSIS — Z0000 Encounter for general adult medical examination without abnormal findings: Secondary | ICD-10-CM

## 2019-02-19 DIAGNOSIS — N50812 Left testicular pain: Secondary | ICD-10-CM

## 2019-02-19 DIAGNOSIS — Z13220 Encounter for screening for lipoid disorders: Secondary | ICD-10-CM

## 2019-02-19 DIAGNOSIS — G43909 Migraine, unspecified, not intractable, without status migrainosus: Secondary | ICD-10-CM

## 2019-02-19 DIAGNOSIS — Z131 Encounter for screening for diabetes mellitus: Secondary | ICD-10-CM | POA: Diagnosis not present

## 2019-02-19 LAB — CBC WITH DIFFERENTIAL/PLATELET
Basophils Absolute: 0 10*3/uL (ref 0.0–0.1)
Basophils Relative: 0.7 % (ref 0.0–3.0)
Eosinophils Absolute: 0.2 10*3/uL (ref 0.0–0.7)
Eosinophils Relative: 3 % (ref 0.0–5.0)
HCT: 46.3 % (ref 39.0–52.0)
Hemoglobin: 15.5 g/dL (ref 13.0–17.0)
Lymphocytes Relative: 33 % (ref 12.0–46.0)
Lymphs Abs: 1.8 10*3/uL (ref 0.7–4.0)
MCHC: 33.5 g/dL (ref 30.0–36.0)
MCV: 90 fl (ref 78.0–100.0)
Monocytes Absolute: 0.4 10*3/uL (ref 0.1–1.0)
Monocytes Relative: 7.6 % (ref 3.0–12.0)
Neutro Abs: 3 10*3/uL (ref 1.4–7.7)
Neutrophils Relative %: 55.7 % (ref 43.0–77.0)
Platelets: 164 10*3/uL (ref 150.0–400.0)
RBC: 5.15 Mil/uL (ref 4.22–5.81)
RDW: 13.2 % (ref 11.5–15.5)
WBC: 5.4 10*3/uL (ref 4.0–10.5)

## 2019-02-19 LAB — COMPREHENSIVE METABOLIC PANEL
ALT: 10 U/L (ref 0–53)
AST: 12 U/L (ref 0–37)
Albumin: 4.7 g/dL (ref 3.5–5.2)
Alkaline Phosphatase: 50 U/L (ref 39–117)
BUN: 14 mg/dL (ref 6–23)
CO2: 30 mEq/L (ref 19–32)
Calcium: 9.8 mg/dL (ref 8.4–10.5)
Chloride: 103 mEq/L (ref 96–112)
Creatinine, Ser: 0.91 mg/dL (ref 0.40–1.50)
GFR: 96.68 mL/min (ref >60.00)
Glucose, Bld: 96 mg/dL (ref 70–99)
Potassium: 4 mEq/L (ref 3.5–5.1)
Sodium: 140 mEq/L (ref 135–145)
Total Bilirubin: 0.8 mg/dL (ref 0.2–1.2)
Total Protein: 7 g/dL (ref 6.0–8.3)

## 2019-02-19 LAB — LIPID PANEL
Cholesterol: 160 mg/dL (ref 0–200)
HDL: 51.1 mg/dL (ref >39.00)
LDL Cholesterol: 89 mg/dL (ref 0–99)
NonHDL: 108.66
Total CHOL/HDL Ratio: 3
Triglycerides: 98 mg/dL (ref 0.0–149.0)
VLDL: 19.6 mg/dL (ref 0.0–40.0)

## 2019-02-19 LAB — TSH: TSH: 0.74 u[IU]/mL (ref 0.35–4.50)

## 2019-02-19 LAB — PSA: PSA: 0.86 ng/mL (ref 0.10–4.00)
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
## 2019-02-20 LAB — URINE CULTURE
MICRO NUMBER:: 10121639
Result:: NO GROWTH
SPECIMEN QUALITY:: ADEQUATE

## 2019-02-22 LAB — URINE CYTOLOGY ANCILLARY ONLY
Chlamydia: NEGATIVE
Comment: NEGATIVE
Comment: NEGATIVE
Comment: NORMAL
Neisseria Gonorrhea: NEGATIVE
Trichomonas: NEGATIVE

## 2019-02-23 ENCOUNTER — Encounter: Payer: Self-pay | Admitting: Family Medicine

## 2019-03-04 ENCOUNTER — Other Ambulatory Visit: Payer: BC Managed Care – PPO

## 2019-03-07 ENCOUNTER — Encounter: Payer: Self-pay | Admitting: Family Medicine

## 2019-03-10 ENCOUNTER — Other Ambulatory Visit: Payer: BC Managed Care – PPO

## 2019-03-24 ENCOUNTER — Encounter: Payer: Self-pay | Admitting: Family Medicine

## 2020-07-12 IMAGING — DX DG CHEST 2V
2 series · 2 of 2 positions shown · non-contrast
Comparison: None.

CLINICAL DATA: Chest pain.

EXAM:
CHEST - 2 VIEW

[chest pa]
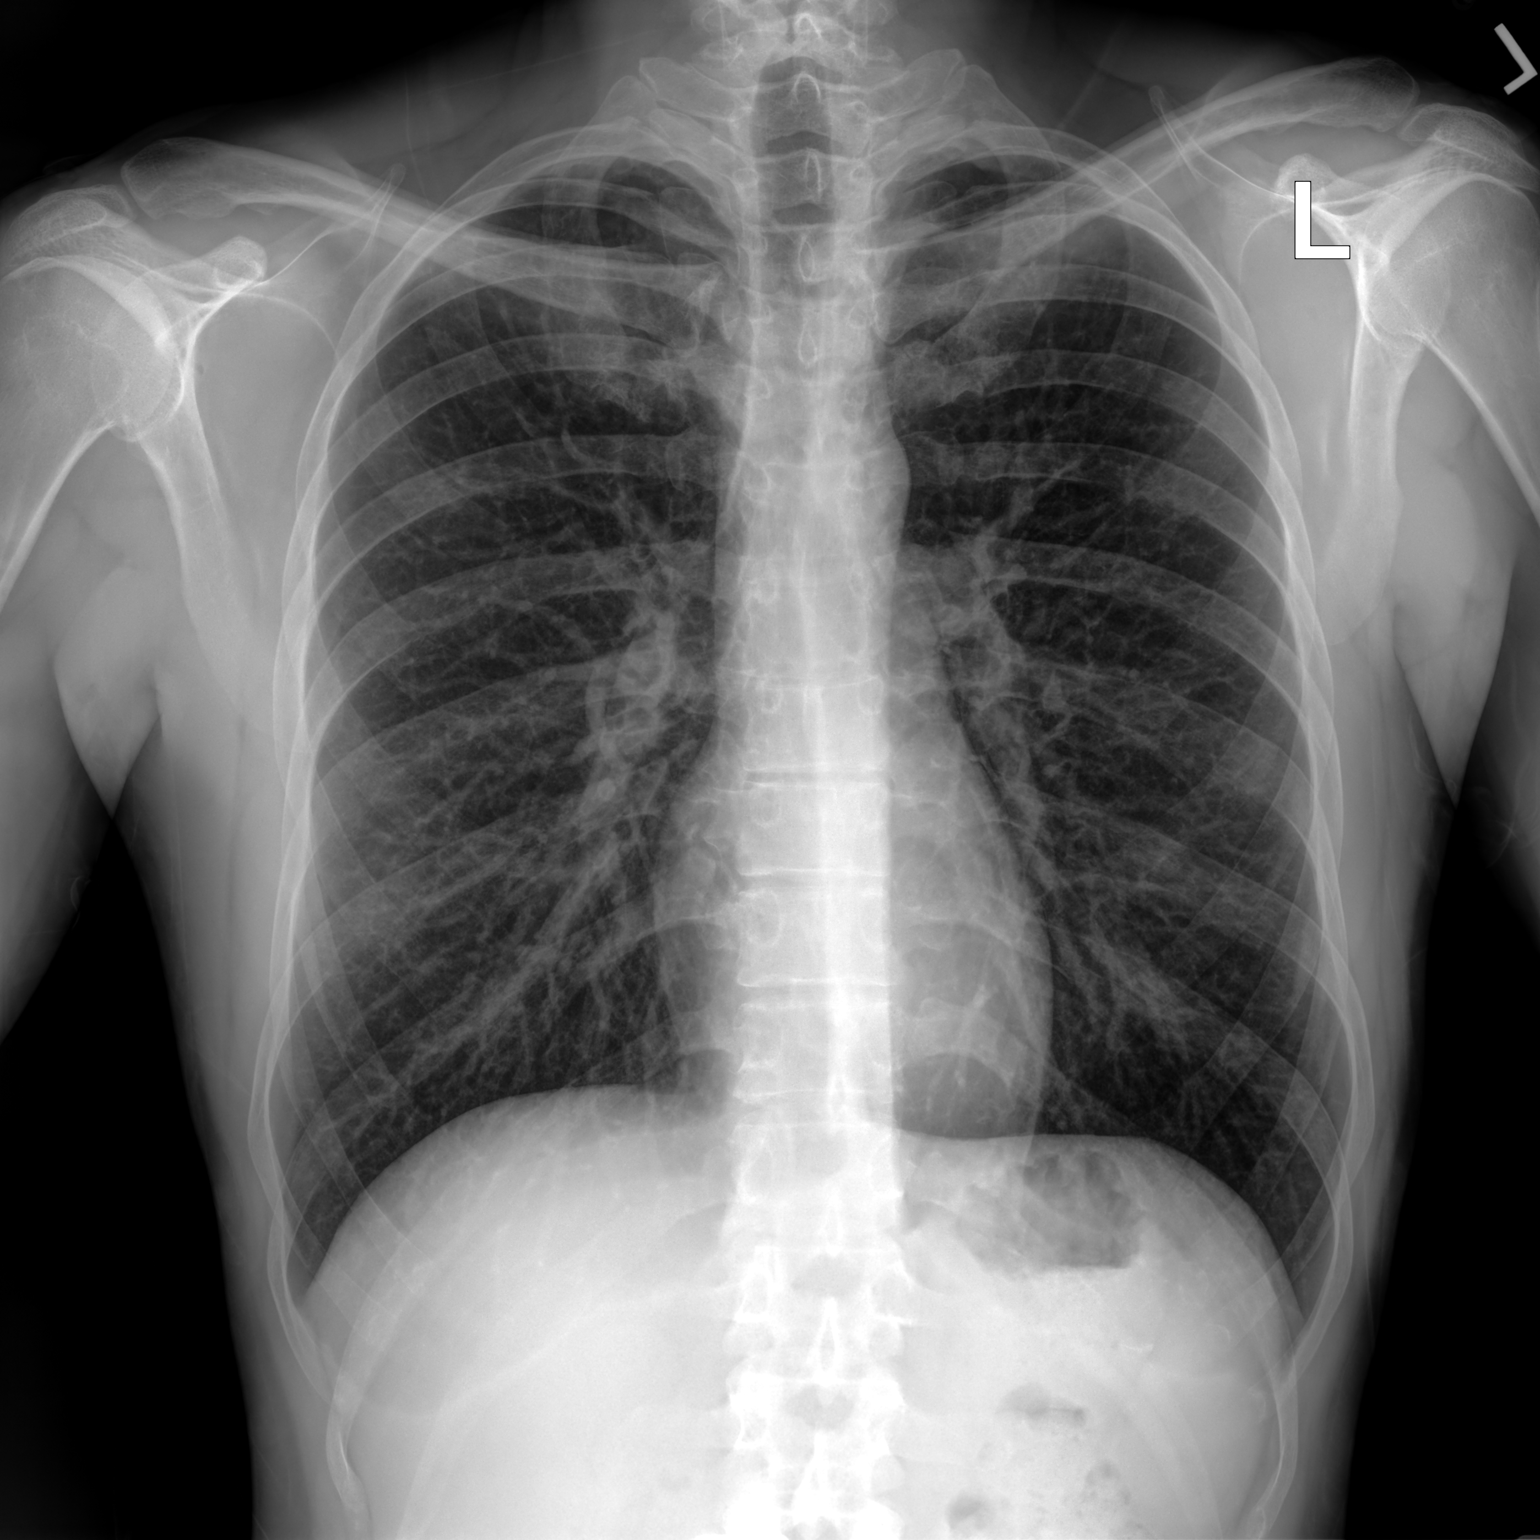

[chest lat]
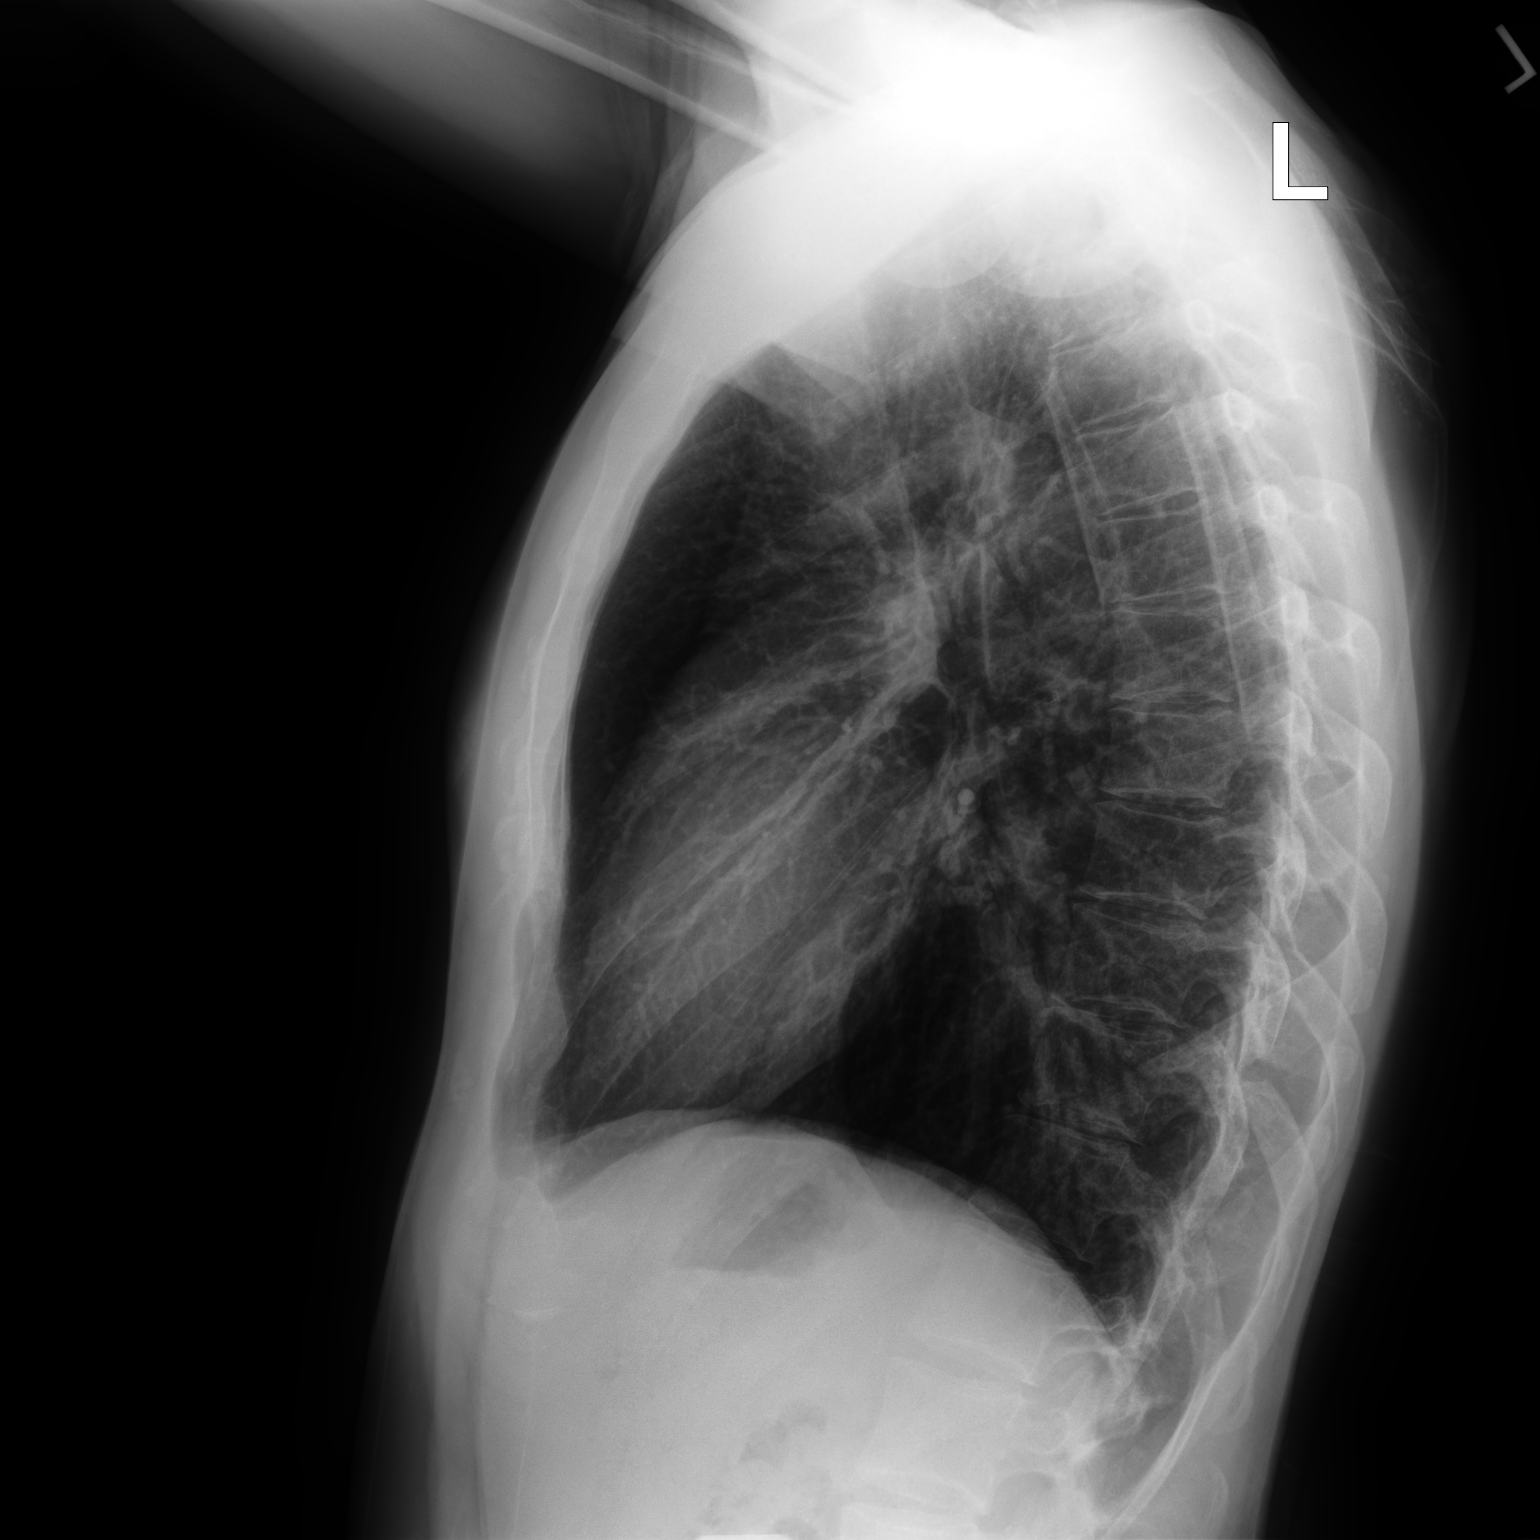

[2 of 2 positions shown; findings below may reference images not displayed]

FINDINGS: The heart size and mediastinal contours are within normal limits.
Both lungs are clear. The visualized skeletal structures are
unremarkable.
IMPRESSION: Normal examination.

## 2021-02-09 ENCOUNTER — Encounter: Payer: Self-pay | Admitting: Family Medicine

## 2021-02-09 ENCOUNTER — Ambulatory Visit (INDEPENDENT_AMBULATORY_CARE_PROVIDER_SITE_OTHER): Payer: BC Managed Care – PPO | Admitting: Family Medicine

## 2021-02-09 DIAGNOSIS — Z13220 Encounter for screening for lipoid disorders: Secondary | ICD-10-CM | POA: Diagnosis not present

## 2021-02-09 DIAGNOSIS — Z131 Encounter for screening for diabetes mellitus: Secondary | ICD-10-CM | POA: Diagnosis not present

## 2021-02-09 DIAGNOSIS — Z0000 Encounter for general adult medical examination without abnormal findings: Secondary | ICD-10-CM

## 2021-02-09 DIAGNOSIS — G43909 Migraine, unspecified, not intractable, without status migrainosus: Secondary | ICD-10-CM | POA: Diagnosis not present

## 2021-02-09 NOTE — Progress Notes (Signed)
Tyler Spencer DOB: [birth year] Encounter date: 02/09/2021  This is a 36 y.o. male who presents for complete physical   History of present illness/Additional concerns: Last visit was 02/19/19.   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   Still has headaches, but not as frequent. Managing these better now. If he gets headache, it is not as severe. Maybe every other month. Takes ibuprofen or tylenol. It does work. In past took maxalt when getting migraines more frequently.   Thinks that he had Tetanus within last 10 years. Thinks maybe 1-2 years he is due.    Past Medical History:  Diagnosis Date   Migraine    Past Surgical History:  Procedure Laterality Date   WISDOM TOOTH EXTRACTION     Allergies  Allergen Reactions   Sulfa Antibiotics Rash   No outpatient medications have been marked as taking for the 02/09/21 encounter (Office Visit) with Caren Macadam, MD.   Social History   Tobacco Use   Smoking status: Never   Smokeless tobacco: Never  Substance Use Topics   Alcohol use: Yes    Alcohol/week: 7.0 standard drinks    Types: 7 Cans of beer per week   Family History  Problem Relation Age of Onset   Heart disease Mother        RMSF followed by a coma x 3 months; affected heart   Migraines Mother    High blood pressure Father    High Cholesterol Father    Cancer Maternal Grandmother    AAA (abdominal aortic aneurysm) Maternal Grandfather    Other Paternal Grandmother        died with pneumonia     Review of Systems  Constitutional:  Negative for activity change, appetite change, chills, fatigue, fever and unexpected weight change.  HENT:  Negative for congestion, ear pain, hearing loss, sinus pressure, sinus pain, sore throat and trouble swallowing.   Eyes:  Negative for pain and visual disturbance.  Respiratory:  Negative for cough, chest tightness, shortness of breath and wheezing.   Cardiovascular:  Negative for chest pain, palpitations and leg swelling.   Gastrointestinal:  Negative for abdominal distention, abdominal pain, blood in stool, constipation, diarrhea, nausea and vomiting.  Genitourinary:  Negative for decreased urine volume, difficulty urinating, dysuria, penile pain and testicular pain.  Musculoskeletal:  Negative for arthralgias, back pain and joint swelling.  Skin:  Negative for rash.  Neurological:  Negative for dizziness, weakness, numbness and headaches.  Hematological:  Negative for adenopathy. Does not bruise/bleed easily.  Psychiatric/Behavioral:  Negative for agitation, sleep disturbance and suicidal ideas. The patient is not nervous/anxious.    CBC:  Lab Results  Component Value Date   WBC 5.4 02/19/2019   HGB 15.5 02/19/2019   HCT 46.3 02/19/2019   MCHC 33.5 02/19/2019   RDW 13.2 02/19/2019   PLT 164.0 02/19/2019   CMP: Lab Results  Component Value Date   NA 140 02/19/2019   K 4.0 02/19/2019   CL 103 02/19/2019   CO2 30 02/19/2019   GLUCOSE 96 02/19/2019   BUN 14 02/19/2019   CREATININE 0.91 02/19/2019   CALCIUM 9.8 02/19/2019   PROT 7.0 02/19/2019   BILITOT 0.8 02/19/2019   ALKPHOS 50 02/19/2019   ALT 10 02/19/2019   AST 12 02/19/2019   LIPID: Lab Results  Component Value Date   CHOL 160 02/19/2019   TRIG 98.0 02/19/2019   HDL 51.10 02/19/2019   LDLCALC 89 02/19/2019    Objective:  There were no  vitals taken for this visit.      BP Readings from Last 3 Encounters:  02/19/19 110/78  01/26/19 116/80  01/27/18 124/76   Wt Readings from Last 3 Encounters:  02/19/19 166 lb 4.8 oz (75.4 kg)  01/26/19 168 lb 6.4 oz (76.4 kg)  01/27/18 165 lb 3.2 oz (74.9 kg)    Physical Exam Constitutional:      General: He is not in acute distress.    Appearance: He is well-developed.  HENT:     Head: Normocephalic and atraumatic.     Right Ear: External ear normal.     Left Ear: External ear normal.     Nose: Nose normal.     Mouth/Throat:     Pharynx: No oropharyngeal exudate.  Eyes:      Conjunctiva/sclera: Conjunctivae normal.     Pupils: Pupils are equal, round, and reactive to light.  Neck:     Thyroid: No thyromegaly.  Cardiovascular:     Rate and Rhythm: Normal rate and regular rhythm.     Heart sounds: Normal heart sounds. No murmur heard.   No friction rub. No gallop.  Pulmonary:     Effort: Pulmonary effort is normal. No respiratory distress.     Breath sounds: Normal breath sounds. No stridor. No wheezing or rales.  Abdominal:     General: Bowel sounds are normal.     Palpations: Abdomen is soft.  Genitourinary:    Penis: Normal and circumcised.      Testes: Normal.  Musculoskeletal:        General: Normal range of motion.     Cervical back: Neck supple.  Skin:    General: Skin is warm and dry.  Neurological:     Mental Status: He is alert and oriented to person, place, and time.  Psychiatric:        Behavior: Behavior normal.        Thought Content: Thought content normal.        Judgment: Judgment normal.    Assessment/Plan: There are no preventive care reminders to display for this patient.  Health Maintenance reviewed - patient prefers to wait on Tdap, thinks it was done in last 10 years. 1. Preventative health care Discussed importance of regular exercise.  He will try to check records for previous Tdap.  2. Migraine without status migrainosus, not intractable, unspecified migraine type Headaches are generally been well controlled and are treatable when he gets them with over-the-counter Tylenol or ibuprofen.  Discussed if any worsening to let us know. - CBC with Differential/Platelet; Future  3. Screening for diabetes mellitus - Comprehensive metabolic panel; Future  4. Lipid screening - Lipid panel; Future   Return in about 1 year (around 02/09/2022) for physical exam.  Micheline Rough, MD

## 2021-02-09 NOTE — Patient Instructions (Signed)
Why is Exercise Important? If I told you I had a single pill that would help you decrease stress by improving anxiety, decreasing depression, help you achieve a healthy weight, give you more energy, make you more productive, help you focus, decrease your risk of dementia/heart attack/stroke/falls, improve your bone health, and more would you be interested? These are just some of the benefits that exercise brings to you. IT IS WORTH carving out some time every day to fit in exercise. It will help in every aspect of your health. Even if you have injuries that prevent you from participating in a type of exercise you used to do; there is always something that you can do to keep exercise a part of your life. If improving your health is important, make exercise your priority.      Exercising to Stay Healthy  Exercising regularly is important. It has many health benefits, such as: Improving your overall fitness, flexibility, and endurance. Increasing your bone density. Helping with weight control. Decreasing your body fat. Increasing your muscle strength. Reducing stress and tension. Improving your overall health.   In order to become healthy and stay healthy, it is recommended that you do moderate-intensity and vigorous-intensity exercise. You can tell that you are exercising at a moderate intensity if you have a higher heart rate and faster breathing, but you are still able to hold a conversation. You can tell that you are exercising at a vigorous intensity if you are breathing much harder and faster and cannot hold a conversation while exercising. How often should I exercise? Choose an activity that you enjoy and set realistic goals. Your health care provider can help you to make an activity plan that works for you. Exercise regularly as directed by your health care provider. This may include: Doing resistance training twice each week, such as: Push-ups. Sit-ups. Lifting weights. Using resistance  bands. Doing a given intensity of exercise for a given amount of time. Choose from these options: 150 minutes of moderate-intensity exercise every week. 75 minutes of vigorous-intensity exercise every week. A mix of moderate-intensity and vigorous-intensity exercise every week.   Children, pregnant women, people who are out of shape, people who are overweight, and older adults may need to consult a health care provider for individual recommendations. If you have any sort of medical condition, be sure to consult your health care provider before starting a new exercise program. What are some exercise ideas? Some moderate-intensity exercise ideas include: Walking at a rate of 1 mile in 15 minutes. Biking. Hiking. Golfing. Dancing.   Some vigorous-intensity exercise ideas include: Walking at a rate of at least 4.5 miles per hour. Jogging or running at a rate of 5 miles per hour. Biking at a rate of at least 10 miles per hour. Lap swimming. Roller-skating or in-line skating. Cross-country skiing. Vigorous competitive sports, such as football, basketball, and soccer. Jumping rope. Aerobic dancing.   What are some everyday activities that can help me to get exercise? Yard work, such as: Child psychotherapist. Raking and bagging leaves. Washing and waxing your car. Pushing a stroller. Shoveling snow. Gardening. Washing windows or floors. How can I be more active in my day-to-day activities? Use the stairs instead of the elevator. Take a walk during your lunch break. If you drive, park your car farther away from work or school. If you take public transportation, get off one stop early and walk the rest of the way. Make all of your phone calls while standing  up and walking around. Get up, stretch, and walk around every 30 minutes throughout the day. What guidelines should I follow while exercising? Do not exercise so much that you hurt yourself, feel dizzy, or get very short of  breath. Consult your health care provider before starting a new exercise program. Wear comfortable clothes and shoes with good support. Drink plenty of water while you exercise to prevent dehydration or heat stroke. Body water is lost during exercise and must be replaced. Work out until you breathe faster and your heart beats faster. This information is not intended to replace advice given to you by your health care provider. Make sure you discuss any questions you have with your health care provider.

## 2021-02-14 ENCOUNTER — Other Ambulatory Visit (INDEPENDENT_AMBULATORY_CARE_PROVIDER_SITE_OTHER): Payer: BC Managed Care – PPO

## 2021-02-14 DIAGNOSIS — G43909 Migraine, unspecified, not intractable, without status migrainosus: Secondary | ICD-10-CM | POA: Diagnosis not present

## 2021-02-14 DIAGNOSIS — Z131 Encounter for screening for diabetes mellitus: Secondary | ICD-10-CM | POA: Diagnosis not present

## 2021-02-14 DIAGNOSIS — Z13220 Encounter for screening for lipoid disorders: Secondary | ICD-10-CM

## 2021-02-14 LAB — COMPREHENSIVE METABOLIC PANEL
ALT: 15 U/L (ref 0–53)
AST: 20 U/L (ref 0–37)
Albumin: 4.7 g/dL (ref 3.5–5.2)
Alkaline Phosphatase: 49 U/L (ref 39–117)
BUN: 13 mg/dL (ref 6–23)
CO2: 31 mEq/L (ref 19–32)
Calcium: 9.6 mg/dL (ref 8.4–10.5)
Chloride: 103 mEq/L (ref 96–112)
Creatinine, Ser: 1.03 mg/dL (ref 0.40–1.50)
GFR: 95.26 mL/min (ref >60.00)
Glucose, Bld: 94 mg/dL (ref 70–99)
Potassium: 3.7 mEq/L (ref 3.5–5.1)
Sodium: 140 mEq/L (ref 135–145)
Total Bilirubin: 0.9 mg/dL (ref 0.2–1.2)
Total Protein: 7.3 g/dL (ref 6.0–8.3)

## 2021-02-14 LAB — CBC WITH DIFFERENTIAL/PLATELET
Basophils Absolute: 0 10*3/uL (ref 0.0–0.1)
Basophils Relative: 0.4 % (ref 0.0–3.0)
Eosinophils Absolute: 0.3 10*3/uL (ref 0.0–0.7)
Eosinophils Relative: 4.6 % (ref 0.0–5.0)
HCT: 46.7 % (ref 39.0–52.0)
Hemoglobin: 15.4 g/dL (ref 13.0–17.0)
Lymphocytes Relative: 33.8 % (ref 12.0–46.0)
Lymphs Abs: 1.9 10*3/uL (ref 0.7–4.0)
MCHC: 33.1 g/dL (ref 30.0–36.0)
MCV: 89.5 fl (ref 78.0–100.0)
Monocytes Absolute: 0.4 10*3/uL (ref 0.1–1.0)
Monocytes Relative: 7.8 % (ref 3.0–12.0)
Neutro Abs: 3 10*3/uL (ref 1.4–7.7)
Neutrophils Relative %: 53.4 % (ref 43.0–77.0)
Platelets: 166 10*3/uL (ref 150.0–400.0)
RBC: 5.21 Mil/uL (ref 4.22–5.81)
RDW: 13.1 % (ref 11.5–15.5)
WBC: 5.6 10*3/uL (ref 4.0–10.5)

## 2021-02-14 LAB — LIPID PANEL
Cholesterol: 167 mg/dL (ref 0–200)
HDL: 53.7 mg/dL (ref >39.00)
LDL Cholesterol: 92 mg/dL (ref 0–99)
NonHDL: 113.75
Total CHOL/HDL Ratio: 3
Triglycerides: 110 mg/dL (ref 0.0–149.0)
VLDL: 22 mg/dL (ref 0.0–40.0)

## 2021-02-14 NOTE — Addendum Note (Signed)
Addended by: Elita Boone E on: 02/14/2021 07:12 AM   Modules accepted: Orders

## 2021-10-19 ENCOUNTER — Ambulatory Visit: Payer: BC Managed Care – PPO | Admitting: Family Medicine

## 2021-10-19 ENCOUNTER — Encounter: Payer: Self-pay | Admitting: Family Medicine

## 2021-10-19 VITALS — BP 120/80 | HR 82 | Temp 97.9°F | Ht 72.75 in | Wt 186.2 lb

## 2021-10-19 DIAGNOSIS — Z3009 Encounter for other general counseling and advice on contraception: Secondary | ICD-10-CM

## 2021-10-19 DIAGNOSIS — N5089 Other specified disorders of the male genital organs: Secondary | ICD-10-CM | POA: Diagnosis not present

## 2021-10-19 NOTE — Progress Notes (Signed)
   Established Patient Office Visit  Subjective   Patient ID: Tyler Spencer, male    DOB: 11-06-1985  Age: 36 y.o. MRN: [id number]  Chief Complaint  Patient presents with   Establish Care    Patient is here for transition of care visit.  Patient reports that he has noticed a new bump on his left testicle. States that he performs self exams occasionally. States that it is not painful, feels like it feels like a "jolt" with palpation, just noticed it about a week ago. States that he thinks he has had an increase in urination recently, states that he felt some lower back and hip pain for about a week, states that his job is somewhat sedentary but he has been doing a lot more heavy lifting at work in the past week. States that when he is standing or moving it feels better. Reports he is sexually active with his wife. Patient is considering vasectomy also.   History of migraines- pt reports that his migraines have improved over time, states that they are very rare in occurrence. States that they started in his teens but have improved since then, usually now just taking ibuprofen as needed when he feels it coming on      No current outpatient medications  Patient Active Problem List   Diagnosis Date Noted   Lipid screening 12/28/2017   Screening for diabetes mellitus 12/28/2017   Migraine       Review of Systems  All other systems reviewed and are negative.     Objective:     BP 120/80 (BP Location: Left Arm, Patient Position: Sitting, Cuff Size: Large)   Pulse 82   Temp 97.9 F (36.6 C) (Oral)   Ht 6' 0.75" (1.848 m)   Wt 186 lb 3.2 oz (84.5 kg)   SpO2 100%   BMI 24.74 kg/m  BP Readings from Last 3 Encounters:  10/19/21 120/80  02/19/19 110/78  01/26/19 116/80      Physical Exam Exam conducted with a chaperone present.  Genitourinary:    Pubic Area: No rash.      Penis: Normal. No discharge or lesions.      Testes: Cremasteric reflex is present.        Left:  Swelling (there is slighty prominence of the superior portion of the testicle, there is slight tenderness to this area when palpated, no edema, erythema or hardness felt on exam) present.      No results found for any visits on 10/19/21.    The ASCVD Risk score (Arnett DK, et al., 2019) failed to calculate for the following reasons:   The 2019 ASCVD risk score is only valid for ages 13 to 34    Assessment & Plan:   Problem List Items Addressed This Visit   None Visit Diagnoses     Vasectomy evaluation    -  Primary   Relevant Orders   Ambulatory referral to Urology   Mass of left testicle       Relevant Orders   US SCROTUM W/DOPPLER  Most likely a benign process, however will go ahead and chek US scrotum to make sure there is no cancerous processor other issues. Patient states he would like referral to urology also to discuss vasectomy, will place this referral and have him follow up with Urology after the [place] is completed.     Return in about 4 months (around 02/19/2022) for Annual Physical Exam.    Farrel Conners, MD

## 2021-10-24 ENCOUNTER — Ambulatory Visit
Admission: RE | Admit: 2021-10-24 | Discharge: 2021-10-24 | Disposition: A | Discharge status: Home / Self Care | Payer: BC Managed Care – PPO | Source: Ambulatory Visit | Attending: Family Medicine | Admitting: Family Medicine

## 2021-10-24 DIAGNOSIS — N5089 Other specified disorders of the male genital organs: Secondary | ICD-10-CM

## 2021-10-25 NOTE — Progress Notes (Signed)
Benign varicocele left testicle, no further work up is indicated at this time.

## 2021-10-25 NOTE — Progress Notes (Signed)
I am not sure, would he like a referral to urology? They are the specialists when it comes to this area.Marland KitchenMarland Kitchen

## 2022-02-19 ENCOUNTER — Encounter: Payer: Self-pay | Admitting: Family Medicine

## 2022-02-19 ENCOUNTER — Ambulatory Visit (INDEPENDENT_AMBULATORY_CARE_PROVIDER_SITE_OTHER): Payer: BC Managed Care – PPO | Admitting: Family Medicine

## 2022-02-19 VITALS — BP 100/78 | HR 109 | Temp 98.2°F | Ht 71.25 in | Wt 179.5 lb

## 2022-02-19 DIAGNOSIS — Z13220 Encounter for screening for lipoid disorders: Secondary | ICD-10-CM | POA: Diagnosis not present

## 2022-02-19 DIAGNOSIS — Z0000 Encounter for general adult medical examination without abnormal findings: Secondary | ICD-10-CM

## 2022-02-19 DIAGNOSIS — Z3009 Encounter for other general counseling and advice on contraception: Secondary | ICD-10-CM

## 2022-02-19 LAB — COMPREHENSIVE METABOLIC PANEL
ALT: 14 U/L (ref 0–53)
AST: 15 U/L (ref 0–37)
Albumin: 4.9 g/dL (ref 3.5–5.2)
Alkaline Phosphatase: 54 U/L (ref 39–117)
BUN: 12 mg/dL (ref 6–23)
CO2: 26 mEq/L (ref 19–32)
Calcium: 9.7 mg/dL (ref 8.4–10.5)
Chloride: 102 mEq/L (ref 96–112)
Creatinine, Ser: 0.91 mg/dL (ref 0.40–1.50)
GFR: 109.74 mL/min (ref >60.00)
Glucose, Bld: 96 mg/dL (ref 70–99)
Potassium: 3.9 mEq/L (ref 3.5–5.1)
Sodium: 140 mEq/L (ref 135–145)
Total Bilirubin: 0.9 mg/dL (ref 0.2–1.2)
Total Protein: 7.4 g/dL (ref 6.0–8.3)

## 2022-02-19 LAB — LIPID PANEL
Cholesterol: 145 mg/dL (ref 0–200)
HDL: 46.3 mg/dL (ref >39.00)
LDL Cholesterol: 72 mg/dL (ref 0–99)
NonHDL: 98.54
Total CHOL/HDL Ratio: 3
Triglycerides: 131 mg/dL (ref 0.0–149.0)
VLDL: 26.2 mg/dL (ref 0.0–40.0)

## 2022-02-19 NOTE — Patient Instructions (Signed)
Health Maintenance, Male Adopting a healthy lifestyle and getting preventive care are important in promoting health and wellness. Ask your health care provider about: The right schedule for you to have regular tests and exams. Things you can do on your own to prevent diseases and keep yourself healthy. What should I know about diet, weight, and exercise? Eat a healthy diet  Eat a diet that includes plenty of vegetables, fruits, low-fat dairy products, and lean protein. Do not eat a lot of foods that are high in solid fats, added sugars, or sodium. Maintain a healthy weight Body mass index (BMI) is a measurement that can be used to identify possible weight problems. It estimates body fat based on height and weight. Your health care provider can help determine your BMI and help you achieve or maintain a healthy weight. Get regular exercise Get regular exercise. This is one of the most important things you can do for your health. Most adults should: Exercise for at least 150 minutes each week. The exercise should increase your heart rate and make you sweat (moderate-intensity exercise). Do strengthening exercises at least twice a week. This is in addition to the moderate-intensity exercise. Spend less time sitting. Even light physical activity can be beneficial. Watch cholesterol and blood lipids Have your blood tested for lipids and cholesterol at 35 years of age, then have this test every 5 years. You may need to have your cholesterol levels checked more often if: Your lipid or cholesterol levels are high. You are older than 35 years of age. You are at high risk for heart disease. What should I know about cancer screening? Many types of cancers can be detected early and may often be prevented. Depending on your health history and family history, you may need to have cancer screening at various ages. This may include screening for: Colorectal cancer. Prostate cancer. Skin cancer. Lung  cancer. What should I know about heart disease, diabetes, and high blood pressure? Blood pressure and heart disease High blood pressure causes heart disease and increases the risk of stroke. This is more likely to develop in people who have high blood pressure readings or are overweight. Talk with your health care provider about your target blood pressure readings. Have your blood pressure checked: Every 3-5 years if you are 18-39 years of age. Every year if you are 40 years old or older. If you are between the ages of 65 and 75 and are a current or former smoker, ask your health care provider if you should have a one-time screening for abdominal aortic aneurysm (AAA). Diabetes Have regular diabetes screenings. This checks your fasting blood sugar level. Have the screening done: Once every three years after age 45 if you are at a normal weight and have a low risk for diabetes. More often and at a younger age if you are overweight or have a high risk for diabetes. What should I know about preventing infection? Hepatitis B If you have a higher risk for hepatitis B, you should be screened for this virus. Talk with your health care provider to find out if you are at risk for hepatitis B infection. Hepatitis C Blood testing is recommended for: Everyone born from 1945 through 1965. Anyone with known risk factors for hepatitis C. Sexually transmitted infections (STIs) You should be screened each year for STIs, including gonorrhea and chlamydia, if: You are sexually active and are younger than 35 years of age. You are older than 35 years of age and your   health care provider tells you that you are at risk for this type of infection. Your sexual activity has changed since you were last screened, and you are at increased risk for chlamydia or gonorrhea. Ask your health care provider if you are at risk. Ask your health care provider about whether you are at high risk for HIV. Your health care provider  may recommend a prescription medicine to help prevent HIV infection. If you choose to take medicine to prevent HIV, you should first get tested for HIV. You should then be tested every 3 months for as long as you are taking the medicine. Follow these instructions at home: Alcohol use Do not drink alcohol if your health care provider tells you not to drink. If you drink alcohol: Limit how much you have to 0-2 drinks a day. Know how much alcohol is in your drink. In the [place] one drink equals one 12 oz bottle of beer (355 mL), one 5 oz glass of wine (148 mL), or one 1 oz glass of hard liquor (44 mL). Lifestyle Do not use any products that contain nicotine or tobacco. These products include cigarettes, chewing tobacco, and vaping devices, such as e-cigarettes. If you need help quitting, ask your health care provider. Do not use street drugs. Do not share needles. Ask your health care provider for help if you need support or information about quitting drugs. General instructions Schedule regular health, dental, and eye exams. Stay current with your vaccines. Tell your health care provider if: You often feel depressed. You have ever been abused or do not feel safe at home. Summary Adopting a healthy lifestyle and getting preventive care are important in promoting health and wellness. Follow your health care provider's instructions about healthy diet, exercising, and getting tested or screened for diseases. Follow your health care provider's instructions on monitoring your cholesterol and blood pressure. This information is not intended to replace advice given to you by your health care provider. Make sure you discuss any questions you have with your health care provider. Document Revised: 05/22/2020 Document Reviewed: 05/22/2020 Elsevier Patient Education  2023 [employer]vier Inc.  

## 2022-02-19 NOTE — Progress Notes (Signed)
Complete physical exam  Patient: Tyler Spencer   DOB: April 28, 1985   37 y.o. Male  MRN: [id number]  Subjective:    Chief Complaint  Patient presents with   Annual Exam    Tyler Spencer is a 37 y.o. male who presents today for a complete physical exam. He reports consuming a general diet.  Patient reports he is not exercising regularly.  He generally feels well. He reports sleeping well. He does not have additional problems to discuss today.    Most recent fall risk assessment:     No data to display           Most recent depression screenings:    02/19/2022    7:56 AM 10/19/2021   10:21 AM  PHQ 2/9 Scores  PHQ - 2 Score 0 1  PHQ- 9 Score 1 3    Vision:Not within last year  and Dental: No current dental problems and Receives regular dental care  Patient Active Problem List   Diagnosis Date Noted   Lipid screening 12/28/2017   Screening for diabetes mellitus 12/28/2017   Migraine       Patient Care Team: Farrel Conners, MD as PCP - General (Family Medicine)   Outpatient Medications Prior to Visit  Medication Sig   Multiple Vitamin (MULTIVITAMIN) capsule Take 1 capsule by mouth daily.   No facility-administered medications prior to visit.    Review of Systems  HENT:  Negative for hearing loss.   Eyes:  Negative for blurred vision.  Respiratory:  Negative for shortness of breath.   Cardiovascular:  Negative for chest pain.  Gastrointestinal: Negative.   Genitourinary: Negative.   Musculoskeletal:  Negative for back pain.  Neurological:  Negative for headaches.  Psychiatric/Behavioral:  Negative for depression.           Objective:     BP 100/78 (BP Location: Left Arm, Patient Position: Sitting, Cuff Size: Normal)   Pulse (!) 109   Temp 98.2 F (36.8 C) (Oral)   Ht 5' 11.25" (1.81 m)   Wt 179 lb 8 oz (81.4 kg)   SpO2 99%   BMI 24.86 kg/m  BP Readings from Last 3 Encounters:  02/19/22 100/78  10/19/21 120/80  02/19/19 110/78       Physical Exam Vitals reviewed.  Constitutional:      Appearance: Normal appearance. He is well-groomed and normal weight.  HENT:     Right Ear: Tympanic membrane normal.     Left Ear: Tympanic membrane normal.     Mouth/Throat:     Mouth: Mucous membranes are moist.     Pharynx: No posterior oropharyngeal erythema.  Eyes:     Extraocular Movements: Extraocular movements intact.     Conjunctiva/sclera: Conjunctivae normal.  Neck:     Thyroid: No thyromegaly.  Cardiovascular:     Rate and Rhythm: Normal rate and regular rhythm.     Pulses: Normal pulses.     Heart sounds: S1 normal and S2 normal. No murmur heard. Pulmonary:     Effort: Pulmonary effort is normal.     Breath sounds: Normal breath sounds and air entry. No rales.  Abdominal:     General: Abdomen is flat. Bowel sounds are normal.     Palpations: Abdomen is soft.  Musculoskeletal:        General: Normal range of motion.     Right lower leg: No edema.     Left lower leg: No edema.  Lymphadenopathy:     Cervical:  No cervical adenopathy.  Neurological:     General: No focal deficit present.     Mental Status: He is alert and oriented to person, place, and time.     Gait: Gait is intact.  Psychiatric:        Mood and Affect: Mood and affect normal.        Behavior: Behavior normal.      No results found for any visits on 02/19/22.     Assessment & Plan:    Routine Health Maintenance and Physical Exam  Immunization History  Administered Date(s) Administered   PFIZER(Purple Top)SARS-COV-2 Vaccination 04/08/2019, 05/03/2019, 02/17/2020    Health Maintenance  Topic Date Due   DTaP/Tdap/Td (1 - Tdap) Never done   COVID-19 Vaccine (4 - 2023-24 season) 03/07/2022 (Originally 09/14/2021)   INFLUENZA VACCINE  04/15/2022 (Originally 08/14/2021)   Hepatitis C Screening  02/20/2023 (Originally 05/03/2005)   HIV Screening  02/20/2023 (Originally 05/04/2002)   HPV VACCINES  Aged Out    Discussed health benefits of  physical activity, and encouraged him to engage in regular exercise appropriate for his age and condition.  Problem List Items Addressed This Visit       Unprioritized   Lipid screening   Relevant Orders   Lipid Panel   Other Visit Diagnoses     Vasectomy evaluation    -  Primary   Relevant Orders   Ambulatory referral to Urology   Preventative health care       Relevant Orders   CMP   Routine general medical examination at a health care facility         Normal physical exam findings. Patient counseled on increasing exercise to maintain cardiac health, handouts given today.  Pt declined vaccinations today, also states he was already screened for HIV in the remote past. Pt also reports that he never heard back from the urologist so I placed a new referral to urology for vasectomy eval.  Return in 1 year (on 02/20/2023) for annual physical exam.     Farrel Conners, MD

## 2022-03-07 ENCOUNTER — Telehealth: Payer: Self-pay | Admitting: Family Medicine

## 2022-03-07 NOTE — Telephone Encounter (Signed)
Spoke with the patient and informed him of the message below.

## 2022-03-07 NOTE — Telephone Encounter (Signed)
Pt call and stated dr. Legrand Como told him to call her back if he don't here from [facility].pt stated he call and can't get anyone.

## 2022-07-22 ENCOUNTER — Other Ambulatory Visit: Payer: Self-pay

## 2022-07-22 ENCOUNTER — Encounter (HOSPITAL_COMMUNITY): Payer: Self-pay | Admitting: Emergency Medicine

## 2022-07-22 ENCOUNTER — Emergency Department (HOSPITAL_COMMUNITY)
Admission: EM | Admit: 2022-07-22 | Discharge: 2022-07-22 | Disposition: A | Discharge status: Home / Self Care | Payer: BC Managed Care – PPO | Attending: Emergency Medicine | Admitting: Emergency Medicine

## 2022-07-22 DIAGNOSIS — Z23 Encounter for immunization: Secondary | ICD-10-CM | POA: Diagnosis present

## 2022-07-22 DIAGNOSIS — Z203 Contact with and (suspected) exposure to rabies: Secondary | ICD-10-CM | POA: Diagnosis not present

## 2022-07-22 DIAGNOSIS — F419 Anxiety disorder, unspecified: Secondary | ICD-10-CM | POA: Insufficient documentation

## 2022-07-22 DIAGNOSIS — Z2914 Encounter for prophylactic rabies immune globin: Secondary | ICD-10-CM | POA: Insufficient documentation

## 2022-07-22 MED ORDER — RABIES VACCINE, PCEC IM SUSR
1.0000 mL | Freq: Once | INTRAMUSCULAR | Status: AC
  Administered 2022-07-22: 1 mL via INTRAMUSCULAR
  Filled 2022-07-22: qty 1

## 2022-07-22 MED ORDER — RABIES IMMUNE GLOBULIN 150 UNIT/ML IM INJ
20.0000 [IU]/kg | INJECTION | Freq: Once | INTRAMUSCULAR | Status: AC
  Administered 2022-07-22: 1650 [IU]
  Filled 2022-07-22: qty 12

## 2022-07-22 NOTE — ED Triage Notes (Signed)
Pt sent for rabies shots. Pts dog got into fight with groundhog on 7/3. Called that they were unable to tell if animal has rabies due to sample not being viable. Pt was not bite, just took care of dog that was hurt.

## 2022-07-22 NOTE — Discharge Instructions (Addendum)
You have been seen today for your complaint of need for rabies vaccination. At this time there does not appear to be the presence of an emergent medical condition, however there is always the potential for conditions to change. Please read and follow the below instructions.  Do not take your medicine if  develop an itchy rash, swelling in your mouth or lips, or difficulty breathing; call 911 and seek immediate emergency medical attention if this occurs.  You may review your lab tests and imaging results in their entirety on your MyChart account.  Please discuss all results of fully with your primary care provider and other specialist at your follow-up visit.  Note: Portions of this text may have been transcribed using voice recognition software. Every effort was made to ensure accuracy; however, inadvertent computerized transcription errors may still be present.

## 2022-07-22 NOTE — ED Provider Notes (Signed)
[place] EMERGENCY DEPARTMENT AT [facility] Provider Note   CSN: [id number] Arrival date & time: 07/22/22  1509     History  Chief Complaint  Patient presents with   Rabies Injection    Tyler Spencer is a 37 y.o. male. With a history of anxiety who presents to the ED for evaluation of possible rabies exposure.  he states his 2 dogs got into an altercation with a groundhog last Wednesday.  He did get blood all over his lower extremities.  he denies any specific wounds to the lower extremity.  he states the animal was taken by animal control, however they were told today that they could not test the animal for rabies as the specimen was not viable and encouraged them to receive the rabies vaccination series.  he denies feeling any in the normal but reports anxiety due to his financial situation and possible rabies exposure.  HPI     Home Medications Prior to Admission medications   Medication Sig Start Date End Date Taking? Authorizing Provider  Multiple Vitamin (MULTIVITAMIN) capsule Take 1 capsule by mouth daily.    [provider]      Allergies    Sulfa antibiotics    Review of Systems   Review of Systems  All other systems reviewed and are negative.   Physical Exam Updated Vital Signs BP (!) 136/104   Pulse (!) 117   Temp 98.1 F (36.7 C) (Oral)   Resp 17   Ht 5' 11.25" (1.81 m)   Wt 81 kg   SpO2 95%   BMI 24.74 kg/m  Physical Exam Vitals and nursing note reviewed.  Constitutional:      General: He is not in acute distress.    Appearance: Normal appearance. He is normal weight. He is not ill-appearing.  HENT:     Head: Normocephalic and atraumatic.  Pulmonary:     Effort: Pulmonary effort is normal. No respiratory distress.  Abdominal:     General: Abdomen is flat.  Musculoskeletal:        General: Normal range of motion.     Cervical back: Neck supple.  Skin:    General: Skin is warm and dry.  Neurological:     Mental  Status: He is alert and oriented to person, place, and time.  Psychiatric:     Comments: Anxious     ED Results / Procedures / Treatments   Labs (all labs ordered are listed, but only abnormal results are displayed) Labs Reviewed - No data to display  EKG EKG Interpretation Date/Time:  Monday July 22 2022 15:19:13 EDT Ventricular Rate:  130 PR Interval:  154 QRS Duration:  94 QT Interval:  310 QTC Calculation: 456 R Axis:   95  Text Interpretation: Sinus tachycardia Right atrial enlargement Rightward axis Pulmonary disease pattern T wave abnormality, consider inferior ischemia Abnormal ECG No previous ECGs available Confirmed by Pricilla Loveless [phone]) on 07/22/2022 3:24:21 PM  Radiology No results found.  Procedures Procedures    Medications Ordered in ED Medications  rabies vaccine (RABAVERT) injection 1 mL (has no administration in time range)  rabies immune globulin (HYPERRAB/KEDRAB) injection 1,650 Units (has no administration in time range)    ED Course/ Medical Decision Making/ A&P                             Medical Decision Making  This patient presents to the ED for concern of possible  rabies exposure, this involves an extensive number of treatment options  Co morbidities that complicate the patient evaluation  anxiety  Additional history obtained from: Nursing notes from this visit.  Afebrile, hemodynamically stable.  Initially tachycardic, however patient states that this is due to hospital anxiety and financial anxiety.  This decreased while he was in the emergency department.  37 year old male presenting to the ED for evaluation of potential rabies exposure.  He was encouraged to receive his rabies vaccination series by animal control.  He denies any other complaints.  He is anxious but otherwise well-appearing on exam.  Rabies vaccination was initiated today.  He was given a CDC handout on rabies vaccinations for scheduling and potential side  effects.  At this time there does not appear to be any evidence of an acute emergency medical condition and the patient appears stable for discharge with appropriate outpatient follow up. Diagnosis was discussed with patient who verbalizes understanding of care plan and is agreeable to discharge. I have discussed return precautions with patient who verbalizes understanding. Patient encouraged to follow-up with their PCP within 1 week. All questions answered.  Note: Portions of this report may have been transcribed using voice recognition software. Every effort was made to ensure accuracy; however, inadvertent computerized transcription errors may still be present.        Final Clinical Impression(s) / ED Diagnoses Final diagnoses:  Need for rabies vaccination    Rx / DC Orders ED Discharge Orders     None         Michelle Piper, Cordelia Poche 07/22/22 1731    Pricilla Loveless, MD 07/25/22 1615

## 2022-07-25 ENCOUNTER — Ambulatory Visit (HOSPITAL_COMMUNITY)
Admission: RE | Admit: 2022-07-25 | Discharge: 2022-07-25 | Disposition: A | Discharge status: Home / Self Care | Payer: BC Managed Care – PPO | Source: Ambulatory Visit | Attending: Internal Medicine | Admitting: Internal Medicine

## 2022-07-25 DIAGNOSIS — Z203 Contact with and (suspected) exposure to rabies: Secondary | ICD-10-CM | POA: Diagnosis not present

## 2022-07-25 MED ORDER — RABIES VACCINE, PCEC IM SUSR
INTRAMUSCULAR | Status: AC
  Filled 2022-07-25: qty 1

## 2022-07-25 MED ORDER — RABIES VACCINE, PCEC IM SUSR
1.0000 mL | Freq: Once | INTRAMUSCULAR | Status: AC
  Administered 2022-07-25: 1 mL via INTRAMUSCULAR

## 2022-07-25 NOTE — ED Triage Notes (Signed)
Patient presenting for day 3 rabies injection. No reaction to previous injections.  

## 2022-07-25 NOTE — ED Notes (Signed)
Day 3 rabies injection given in the right deltoid. Patient tolerated well.  

## 2022-07-29 ENCOUNTER — Ambulatory Visit (HOSPITAL_COMMUNITY)
Admission: EM | Admit: 2022-07-29 | Discharge: 2022-07-29 | Disposition: A | Discharge status: Home / Self Care | Payer: BC Managed Care – PPO | Attending: Internal Medicine | Admitting: Internal Medicine

## 2022-07-29 DIAGNOSIS — Z203 Contact with and (suspected) exposure to rabies: Secondary | ICD-10-CM

## 2022-07-29 MED ORDER — RABIES VACCINE, PCEC IM SUSR
INTRAMUSCULAR | Status: AC
  Filled 2022-07-29: qty 1

## 2022-07-29 MED ORDER — RABIES VACCINE, PCEC IM SUSR
1.0000 mL | Freq: Once | INTRAMUSCULAR | Status: AC
  Administered 2022-07-29: 1 mL via INTRAMUSCULAR

## 2022-07-29 NOTE — ED Triage Notes (Signed)
Pt here for 3rd rabies injection 

## 2022-08-05 ENCOUNTER — Encounter (HOSPITAL_COMMUNITY): Payer: Self-pay

## 2022-08-05 ENCOUNTER — Ambulatory Visit (HOSPITAL_COMMUNITY)
Admission: EM | Admit: 2022-08-05 | Discharge: 2022-08-05 | Disposition: A | Discharge status: Home / Self Care | Payer: BC Managed Care – PPO | Attending: Internal Medicine | Admitting: Internal Medicine

## 2022-08-05 VITALS — BP 128/94 | HR 89 | Temp 98.6°F | Resp 16 | Ht 71.25 in | Wt 178.6 lb

## 2022-08-05 DIAGNOSIS — Z203 Contact with and (suspected) exposure to rabies: Secondary | ICD-10-CM

## 2022-08-05 MED ORDER — RABIES VACCINE, PCEC IM SUSR
1.0000 mL | Freq: Once | INTRAMUSCULAR | Status: AC
  Administered 2022-08-05: 1 mL via INTRAMUSCULAR

## 2022-08-05 MED ORDER — RABIES VACCINE, PCEC IM SUSR
INTRAMUSCULAR | Status: AC
  Filled 2022-08-05: qty 1

## 2022-08-05 NOTE — ED Triage Notes (Signed)
Patient here today for his day 14 rabies vaccine.

## 2023-02-20 ENCOUNTER — Ambulatory Visit: Payer: 59 | Admitting: Family Medicine

## 2023-02-20 ENCOUNTER — Encounter: Payer: Self-pay | Admitting: Family Medicine

## 2023-02-20 VITALS — BP 130/88 | HR 99 | Temp 97.8°F | Ht 72.0 in | Wt 179.7 lb

## 2023-02-20 DIAGNOSIS — M79609 Pain in unspecified limb: Secondary | ICD-10-CM | POA: Diagnosis not present

## 2023-02-20 DIAGNOSIS — Z13220 Encounter for screening for lipoid disorders: Secondary | ICD-10-CM | POA: Diagnosis not present

## 2023-02-20 DIAGNOSIS — F419 Anxiety disorder, unspecified: Secondary | ICD-10-CM

## 2023-02-20 DIAGNOSIS — Z0001 Encounter for general adult medical examination with abnormal findings: Secondary | ICD-10-CM

## 2023-02-20 DIAGNOSIS — R002 Palpitations: Secondary | ICD-10-CM

## 2023-02-20 DIAGNOSIS — Z0000 Encounter for general adult medical examination without abnormal findings: Secondary | ICD-10-CM

## 2023-02-20 DIAGNOSIS — R202 Paresthesia of skin: Secondary | ICD-10-CM | POA: Diagnosis not present

## 2023-02-20 LAB — LIPID PANEL
Cholesterol: 152 mg/dL (ref 0–200)
HDL: 53.1 mg/dL (ref >39.00)
LDL Cholesterol: 77 mg/dL (ref 0–99)
NonHDL: 98.52
Total CHOL/HDL Ratio: 3
Triglycerides: 110 mg/dL (ref 0.0–149.0)
VLDL: 22 mg/dL (ref 0.0–40.0)

## 2023-02-20 LAB — CBC WITH DIFFERENTIAL/PLATELET
Basophils Absolute: 0.1 10*3/uL (ref 0.0–0.1)
Basophils Relative: 1 % (ref 0.0–3.0)
Eosinophils Absolute: 0.3 10*3/uL (ref 0.0–0.7)
Eosinophils Relative: 4.7 % (ref 0.0–5.0)
HCT: 47.1 % (ref 39.0–52.0)
Hemoglobin: 15.4 g/dL (ref 13.0–17.0)
Lymphocytes Relative: 31.2 % (ref 12.0–46.0)
Lymphs Abs: 1.7 10*3/uL (ref 0.7–4.0)
MCHC: 32.7 g/dL (ref 30.0–36.0)
MCV: 90.4 fL (ref 78.0–100.0)
Monocytes Absolute: 0.3 10*3/uL (ref 0.1–1.0)
Monocytes Relative: 5.9 % (ref 3.0–12.0)
Neutro Abs: 3 10*3/uL (ref 1.4–7.7)
Neutrophils Relative %: 57.2 % (ref 43.0–77.0)
Platelets: 173 10*3/uL (ref 150.0–400.0)
RBC: 5.21 Mil/uL (ref 4.22–5.81)
RDW: 13.6 % (ref 11.5–15.5)
WBC: 5.3 10*3/uL (ref 4.0–10.5)

## 2023-02-20 LAB — COMPREHENSIVE METABOLIC PANEL
ALT: 19 U/L (ref 0–53)
AST: 17 U/L (ref 0–37)
Albumin: 4.8 g/dL (ref 3.5–5.2)
Alkaline Phosphatase: 61 U/L (ref 39–117)
BUN: 14 mg/dL (ref 6–23)
CO2: 28 meq/L (ref 19–32)
Calcium: 9.7 mg/dL (ref 8.4–10.5)
Chloride: 102 meq/L (ref 96–112)
Creatinine, Ser: 0.83 mg/dL (ref 0.40–1.50)
GFR: 113.16 mL/min (ref >60.00)
Glucose, Bld: 93 mg/dL (ref 70–99)
Potassium: 4 meq/L (ref 3.5–5.1)
Sodium: 139 meq/L (ref 135–145)
Total Bilirubin: 0.8 mg/dL (ref 0.2–1.2)
Total Protein: 7.1 g/dL (ref 6.0–8.3)

## 2023-02-20 LAB — VITAMIN D 25 HYDROXY (VIT D DEFICIENCY, FRACTURES): VITD: 15.58 ng/mL — ABNORMAL LOW (ref 30.00–100.00)

## 2023-02-20 LAB — VITAMIN B12: Vitamin B-12: 201 pg/mL — ABNORMAL LOW (ref 211–911)

## 2023-02-20 LAB — TSH: TSH: 0.73 u[IU]/mL (ref 0.35–5.50)

## 2023-02-20 MED ORDER — VENLAFAXINE HCL ER 37.5 MG PO CP24
37.5000 mg | ORAL_CAPSULE | Freq: Every day | ORAL | 3 refills | Status: AC
Start: 2023-02-20 — End: ?

## 2023-02-20 NOTE — Progress Notes (Signed)
 Complete physical exam  Patient: Tyler Spencer   DOB: [birth year]   37 y.o. Male  MRN: [id number]  Subjective:    Chief Complaint  Patient presents with   Annual Exam    Tyler Spencer is a 38 y.o. male who presents today for a complete physical exam. He reports consuming a general diet. Home exercise routine includes walking 2 hrs per week. He generally feels see below. He reports sleeping fairly well. He does have additional problems to discuss today.   Pt states that he was seen in the ER over the summer due to a possible rabies exposure,states that he had a panic attack in the ER and they did an EKG that was abnormal. I reviewed his EKG which showed sinus tachycardia with rightward deviation. States that within the last year he has been having left arm/shoulder numbness and tingling, going down to his fingers on the left, occasionally happens at night. States that he also noticed he is having more panic attacks this year, states that he switched jobs this year and feels that he is more anxious and feels very keyed up. Sleep at night is ok sometimes has difficulty falling asleep.   Most recent fall risk assessment:    02/20/2023    8:04 AM  Fall Risk   Falls in the past year? 0  Number falls in past yr: 0  Injury with Fall? 0  Follow up Falls evaluation completed     Most recent depression screenings:    02/20/2023    8:04 AM 02/19/2022    7:56 AM  PHQ 2/9 Scores  PHQ - 2 Score 1 0  PHQ- 9 Score 3 1    Vision:Not within last year  and no vision issues currently and Dental: No current dental problems and Receives regular dental care  Patient Active Problem List   Diagnosis Date Noted   Anxiety 03/03/2023   Heart palpitations 03/03/2023   Paresthesia and pain of left extremity 03/03/2023   Lipid screening 12/28/2017   Screening for diabetes mellitus 12/28/2017   Migraine       Patient Care Team: Ozell Heron HERO, MD as PCP - General (Family Medicine)    Outpatient Medications Prior to Visit  Medication Sig   Multiple Vitamin (MULTIVITAMIN) capsule Take 1 capsule by mouth daily.   No facility-administered medications prior to visit.    Review of Systems  Constitutional:  Negative for malaise/fatigue and weight loss.  HENT:  Negative for hearing loss.   Eyes:  Negative for blurred vision.  Respiratory:  Negative for shortness of breath.   Cardiovascular:  Negative for chest pain.  Gastrointestinal:  Positive for heartburn.  Genitourinary: Negative.   Musculoskeletal:  Negative for back pain.  Neurological:  Positive for sensory change. Negative for headaches.  Psychiatric/Behavioral:  Negative for depression. The patient is nervous/anxious and has insomnia.        Objective:     BP 130/88 (BP Location: Left Arm, Patient Position: Sitting, Cuff Size: Normal)   Pulse 99   Temp 97.8 F (36.6 C) (Oral)   Ht 6' (1.829 m)   Wt 179 lb 11.2 oz (81.5 kg)   SpO2 100%   BMI 24.37 kg/m    Physical Exam Vitals reviewed.  Constitutional:      Appearance: Normal appearance. He is well-groomed and normal weight.  HENT:     Right Ear: Tympanic membrane and ear canal normal.     Left Ear: Tympanic membrane and ear canal normal.  Mouth/Throat:     Mouth: Mucous membranes are moist.     Pharynx: No posterior oropharyngeal erythema.  Eyes:     Conjunctiva/sclera: Conjunctivae normal.  Neck:     Thyroid: No thyromegaly.  Cardiovascular:     Rate and Rhythm: Normal rate and regular rhythm.     Heart sounds: S1 normal and S2 normal. No murmur heard. Pulmonary:     Effort: Pulmonary effort is normal.     Breath sounds: Normal breath sounds and air entry. No rales.  Abdominal:     General: Abdomen is flat. Bowel sounds are normal.  Musculoskeletal:     Right lower leg: No edema.     Left lower leg: No edema.  Lymphadenopathy:     Cervical: No cervical adenopathy.  Neurological:     General: No focal deficit present.      Mental Status: He is alert and oriented to person, place, and time.     Gait: Gait is intact.  Psychiatric:        Mood and Affect: Mood and affect normal.    EKG interpretation note:  EKG: normal EKG, normal sinus rhythm, heart rate of 84. there are no previous tracings available for comparison, normal sinus rhythm. No ST or T wave abnormalities. I reviewed the findings with the patient in the visit today.      Assessment & Plan:    Routine Health Maintenance and Physical Exam  Immunization History  Administered Date(s) Administered   PFIZER(Purple Top)SARS-COV-2 Vaccination 04/08/2019, 05/03/2019, 02/17/2020   Rabies, IM 07/22/2022, 07/25/2022, 07/29/2022, 08/05/2022    Health Maintenance  Topic Date Due   HIV Screening  Never done   Hepatitis C Screening  Never done   COVID-19 Vaccine (4 - 2024-25 season) 09/15/2022   INFLUENZA VACCINE  04/14/2023 (Originally 08/15/2022)   DTaP/Tdap/Td (1 - Tdap) 02/20/2024 (Originally 05/04/2006)   HPV VACCINES  Aged Out    Discussed health benefits of physical activity, and encouraged him to engage in regular exercise appropriate for his age and condition.  Encounter for general adult medical examination with abnormal findings See below, physical exam findings are normal today, I counseled the patient on healthy sleep habits, handouts given on healthy eating and exercise, health maintenance was reviewed.   -     CBC with Differential/Platelet; Future  Paresthesia and pain of left extremity Assessment & Plan: New symptom, will check vitamin levels to rule out deficiency  Orders: -     Vitamin B12; Future -     VITAMIN D  25 Hydroxy (Vit-D Deficiency, Fractures); Future  Anxiety Assessment & Plan: New problem, I counseled the patient on behavioral ways to reduce anxiety including deep breathing, exercise, etc. We discussed treatment options as well, I reviewed the use of SSRI/SNRI's, will start venlafaxine  at a low dose, will follow up  with him in 2-3 months to see how the medication is working for him   Orders: -     Comprehensive metabolic panel; Future -     TSH; Future -     Venlafaxine  HCl ER; Take 1 capsule (37.5 mg total) by mouth daily with breakfast.  Dispense: 30 capsule; Refill: 3  Lipid screening -     Lipid panel; Future  Heart palpitations Assessment & Plan: EKG is normal, reassured patient, this could be related to the increase in anxiety he is experiencing, labs have been ordered for surveillance today and to rule out other causes  Orders: -     EKG 12-Lead  No follow-ups on file.     Heron CHRISTELLA Sharper, MD

## 2023-03-03 DIAGNOSIS — F419 Anxiety disorder, unspecified: Secondary | ICD-10-CM | POA: Insufficient documentation

## 2023-03-03 DIAGNOSIS — R202 Paresthesia of skin: Secondary | ICD-10-CM | POA: Insufficient documentation

## 2023-03-03 DIAGNOSIS — R002 Palpitations: Secondary | ICD-10-CM | POA: Insufficient documentation

## 2023-03-03 NOTE — Assessment & Plan Note (Signed)
 New problem, I counseled the patient on behavioral ways to reduce anxiety including deep breathing, exercise, etc. We discussed treatment options as well, I reviewed the use of SSRI/SNRI's, will start venlafaxine at a low dose, will follow up with him in 2-3 months to see how the medication is working for him

## 2023-03-03 NOTE — Assessment & Plan Note (Signed)
 New symptom, will check vitamin levels to rule out deficiency

## 2023-03-03 NOTE — Assessment & Plan Note (Signed)
 EKG is normal, reassured patient, this could be related to the increase in anxiety he is experiencing, labs have been ordered for surveillance today and to rule out other causes

## 2024-01-14 ENCOUNTER — Telehealth: Admitting: Physician Assistant

## 2024-01-14 DIAGNOSIS — J208 Acute bronchitis due to other specified organisms: Secondary | ICD-10-CM

## 2024-01-14 MED ORDER — BENZONATATE 100 MG PO CAPS: 100.0000 mg | ORAL_CAPSULE | Freq: Three times a day (TID) | ORAL | 0 refills | Status: AC | PRN

## 2024-01-14 MED ORDER — PSEUDOEPH-BROMPHEN-DM 30-2-10 MG/5ML PO SYRP: 5.0000 mL | ORAL_SOLUTION | Freq: Four times a day (QID) | ORAL | 0 refills | Status: AC | PRN

## 2024-01-14 NOTE — Progress Notes (Signed)
 We are sorry that you are not feeling well.  Here is how we plan to help!  Based on your presentation I believe you most likely have A cough due to a virus.  This is called viral bronchitis and is best treated by rest, plenty of fluids and control of the cough.  You may use Ibuprofen or Tylenol as directed to help your symptoms.     In addition you may use A prescription cough medication called Tessalon Perles 100mg . You may take 1-2 capsules every 8 hours as needed for your cough. AND a cough syrup called Bromfed DM Take 5mL every 6 hours as needed. Can be used together with Tessalon perles.   From your responses in the eVisit questionnaire you describe inflammation in the upper respiratory tract which is causing a significant cough.  This is commonly called Bronchitis and has four common causes:   Allergies Viral Infections Acid Reflux Bacterial Infection Allergies, viruses and acid reflux are treated by controlling symptoms or eliminating the cause. An example might be a cough caused by taking certain blood pressure medications. You stop the cough by changing the medication. Another example might be a cough caused by acid reflux. Controlling the reflux helps control the cough.  USE OF BRONCHODILATOR (RESCUE) INHALERS: There is a risk from using your bronchodilator too frequently.  The risk is that over-reliance on a medication which only relaxes the muscles surrounding the breathing tubes can reduce the effectiveness of medications prescribed to reduce swelling and congestion of the tubes themselves.  Although you feel brief relief from the bronchodilator inhaler, your asthma may actually be worsening with the tubes becoming more swollen and filled with mucus.  This can delay other crucial treatments, such as oral steroid medications. If you need to use a bronchodilator inhaler daily, several times per day, you should discuss this with your provider.  There are probably better treatments that  could be used to keep your asthma under control.     HOME CARE Only take medications as instructed by your medical team. Complete the entire course of an antibiotic. Drink plenty of fluids and get plenty of rest. Avoid close contacts especially the very young and the elderly Cover your mouth if you cough or cough into your sleeve. Always remember to wash your hands A steam or ultrasonic humidifier can help congestion.   GET HELP RIGHT AWAY IF: You develop worsening fever. You become short of breath You cough up blood. Your symptoms persist after you have completed your treatment plan MAKE SURE YOU  Understand these instructions. Will watch your condition. Will get help right away if you are not doing well or get worse.  Your e-visit answers were reviewed by a board certified advanced clinical practitioner to complete your personal care plan.  Depending on the condition, your plan could have included both over the counter or prescription medications. If there is a problem please reply  once you have received a response from your provider. Your safety is important to us .  If you have drug allergies check your prescription carefully.    You can use MyChart to ask questions about todays visit, request a non-urgent call back, or ask for a work or school excuse for 24 hours related to this e-Visit. If it has been greater than 24 hours you will need to follow up with your provider, or enter a new e-Visit to address those concerns. You will get an e-mail in the next two days asking about your  experience.  I hope that your e-visit has been valuable and will speed your recovery. Thank you for using e-visits.   I have spent 5 minutes in review of e-visit questionnaire, review and updating patient chart, medical decision making and response to patient.   Delon CHRISTELLA Dickinson, PA-C
